# Patient Record
Sex: Female | Born: 1975 | Race: Black or African American | Hispanic: No | Smoking: Never smoker
Health system: Southern US, Community
[De-identification: ages and names within clinical notes are randomized; demographics above are authoritative.]

## PROBLEM LIST (undated history)

## (undated) VITALS — BP 122/70 | HR 102 | Temp 98.5°F | Resp 18 | Ht 68.0 in | Wt 315.0 lb

## (undated) DIAGNOSIS — Z91013 Allergy to seafood: Secondary | ICD-10-CM

## (undated) DIAGNOSIS — R609 Edema, unspecified: Secondary | ICD-10-CM

## (undated) DIAGNOSIS — R81 Glycosuria: Secondary | ICD-10-CM

## (undated) DIAGNOSIS — Z62819 Personal history of unspecified abuse in childhood: Secondary | ICD-10-CM

## (undated) DIAGNOSIS — B379 Candidiasis, unspecified: Secondary | ICD-10-CM

## (undated) DIAGNOSIS — B009 Herpesviral infection, unspecified: Secondary | ICD-10-CM

## (undated) DIAGNOSIS — F32A Depression, unspecified: Secondary | ICD-10-CM

## (undated) DIAGNOSIS — G4730 Sleep apnea, unspecified: Secondary | ICD-10-CM

## (undated) DIAGNOSIS — O2600 Excessive weight gain in pregnancy, unspecified trimester: Secondary | ICD-10-CM

## (undated) DIAGNOSIS — Z8619 Personal history of other infectious and parasitic diseases: Secondary | ICD-10-CM

## (undated) DIAGNOSIS — A599 Trichomoniasis, unspecified: Secondary | ICD-10-CM

## (undated) DIAGNOSIS — R300 Dysuria: Secondary | ICD-10-CM

## (undated) DIAGNOSIS — B977 Papillomavirus as the cause of diseases classified elsewhere: Secondary | ICD-10-CM

## (undated) DIAGNOSIS — F329 Major depressive disorder, single episode, unspecified: Secondary | ICD-10-CM

## (undated) DIAGNOSIS — Z8659 Personal history of other mental and behavioral disorders: Secondary | ICD-10-CM

## (undated) DIAGNOSIS — E559 Vitamin D deficiency, unspecified: Secondary | ICD-10-CM

## (undated) DIAGNOSIS — N879 Dysplasia of cervix uteri, unspecified: Secondary | ICD-10-CM

## (undated) DIAGNOSIS — Z3041 Encounter for surveillance of contraceptive pills: Secondary | ICD-10-CM

## (undated) DIAGNOSIS — Z309 Encounter for contraceptive management, unspecified: Secondary | ICD-10-CM

## (undated) HISTORY — PX: WISDOM TOOTH EXTRACTION: SHX21

## (undated) HISTORY — DX: Personal history of other infectious and parasitic diseases: Z86.19

## (undated) HISTORY — DX: Depression, unspecified: F32.A

## (undated) HISTORY — DX: Excessive weight gain in pregnancy, unspecified trimester: O26.00

## (undated) HISTORY — DX: Personal history of other mental and behavioral disorders: Z86.59

## (undated) HISTORY — DX: Edema, unspecified: R60.9

## (undated) HISTORY — DX: Herpesviral infection, unspecified: B00.9

## (undated) HISTORY — DX: Personal history of unspecified abuse in childhood: Z62.819

## (undated) HISTORY — DX: Vitamin D deficiency, unspecified: E55.9

## (undated) HISTORY — DX: Trichomoniasis, unspecified: A59.9

## (undated) HISTORY — DX: Papillomavirus as the cause of diseases classified elsewhere: B97.7

## (undated) HISTORY — DX: Allergy to seafood: Z91.013

## (undated) HISTORY — DX: Glycosuria: R81

## (undated) HISTORY — DX: Candidiasis, unspecified: B37.9

## (undated) HISTORY — DX: Dysuria: R30.0

## (undated) HISTORY — DX: Major depressive disorder, single episode, unspecified: F32.9

---

## 2006-05-20 HISTORY — PX: GASTRIC BYPASS: SHX52

## 2007-02-19 HISTORY — PX: OTHER SURGICAL HISTORY: SHX169

## 2010-11-08 ENCOUNTER — Other Ambulatory Visit: Payer: Self-pay | Admitting: Unknown Physician Specialty

## 2010-11-08 ENCOUNTER — Ambulatory Visit
Admission: RE | Admit: 2010-11-08 | Discharge: 2010-11-08 | Disposition: A | Discharge status: Home / Self Care | Payer: BC Managed Care – PPO | Source: Ambulatory Visit | Attending: Unknown Physician Specialty | Admitting: Unknown Physician Specialty

## 2010-11-08 DIAGNOSIS — T1490XA Injury, unspecified, initial encounter: Secondary | ICD-10-CM

## 2010-12-27 DIAGNOSIS — R300 Dysuria: Secondary | ICD-10-CM

## 2010-12-27 HISTORY — DX: Dysuria: R30.0

## 2010-12-27 LAB — OB RESULTS CONSOLE GC/CHLAMYDIA
Chlamydia: NEGATIVE
Gonorrhea: NEGATIVE

## 2011-01-23 DIAGNOSIS — B009 Herpesviral infection, unspecified: Secondary | ICD-10-CM | POA: Insufficient documentation

## 2011-01-23 HISTORY — DX: Herpesviral infection, unspecified: B00.9

## 2011-01-31 DIAGNOSIS — E559 Vitamin D deficiency, unspecified: Secondary | ICD-10-CM

## 2011-01-31 HISTORY — DX: Vitamin D deficiency, unspecified: E55.9

## 2011-02-19 NOTE — L&D Delivery Note (Signed)
Delivery Note At 2:18 PM a viable female, "Bianca Strickland", was delivered via Vaginal, Spontaneous Delivery (Presentation: Right Occiput Anterior).  APGAR: 9, 9; weight .   Placenta status: Intact, Spontaneous.  Cord: 3 vessels with the following complications: None.  Cord pH: NA  Anesthesia: Epidural  Episiotomy: None Lacerations: Periurethral;Vaginal;1st degree--repaired for hemostasis Suture Repair: 3.0 vicryl Est. Blood Loss (mL): 400 cc  Mom to postpartum.  Baby to skin to skin.Marland Kitchen  [name] 08/25/2011, 3:00 PM

## 2011-02-25 LAB — OB RESULTS CONSOLE HEPATITIS B SURFACE ANTIGEN: Hepatitis B Surface Ag: NEGATIVE

## 2011-02-25 LAB — OB RESULTS CONSOLE HIV ANTIBODY (ROUTINE TESTING): HIV: NONREACTIVE

## 2011-02-25 LAB — OB RESULTS CONSOLE RUBELLA ANTIBODY, IGM: Rubella: IMMUNE

## 2011-04-23 ENCOUNTER — Other Ambulatory Visit: Payer: BC Managed Care – PPO

## 2011-04-23 ENCOUNTER — Encounter (INDEPENDENT_AMBULATORY_CARE_PROVIDER_SITE_OTHER): Payer: BC Managed Care – PPO | Admitting: Obstetrics and Gynecology

## 2011-04-23 DIAGNOSIS — Z3689 Encounter for other specified antenatal screening: Secondary | ICD-10-CM

## 2011-04-23 DIAGNOSIS — O09519 Supervision of elderly primigravida, unspecified trimester: Secondary | ICD-10-CM

## 2011-05-21 ENCOUNTER — Encounter (INDEPENDENT_AMBULATORY_CARE_PROVIDER_SITE_OTHER): Payer: BC Managed Care – PPO | Admitting: Obstetrics and Gynecology

## 2011-05-21 ENCOUNTER — Other Ambulatory Visit (INDEPENDENT_AMBULATORY_CARE_PROVIDER_SITE_OTHER): Payer: BC Managed Care – PPO

## 2011-05-21 DIAGNOSIS — Z3400 Encounter for supervision of normal first pregnancy, unspecified trimester: Secondary | ICD-10-CM

## 2011-05-21 DIAGNOSIS — O358XX0 Maternal care for other (suspected) fetal abnormality and damage, not applicable or unspecified: Secondary | ICD-10-CM

## 2011-06-17 ENCOUNTER — Encounter: Payer: Self-pay | Admitting: Obstetrics and Gynecology

## 2011-06-17 ENCOUNTER — Other Ambulatory Visit: Payer: Self-pay | Admitting: Obstetrics and Gynecology

## 2011-06-17 DIAGNOSIS — IMO0002 Reserved for concepts with insufficient information to code with codable children: Secondary | ICD-10-CM | POA: Insufficient documentation

## 2011-06-17 DIAGNOSIS — O358XX0 Maternal care for other (suspected) fetal abnormality and damage, not applicable or unspecified: Secondary | ICD-10-CM

## 2011-06-21 ENCOUNTER — Ambulatory Visit (INDEPENDENT_AMBULATORY_CARE_PROVIDER_SITE_OTHER): Payer: BC Managed Care – PPO

## 2011-06-21 ENCOUNTER — Encounter: Payer: Self-pay | Admitting: Obstetrics and Gynecology

## 2011-06-21 ENCOUNTER — Ambulatory Visit (INDEPENDENT_AMBULATORY_CARE_PROVIDER_SITE_OTHER): Payer: BC Managed Care – PPO | Admitting: Obstetrics and Gynecology

## 2011-06-21 ENCOUNTER — Other Ambulatory Visit: Payer: BC Managed Care – PPO

## 2011-06-21 VITALS — BP 102/62 | Wt 274.0 lb

## 2011-06-21 DIAGNOSIS — O358XX0 Maternal care for other (suspected) fetal abnormality and damage, not applicable or unspecified: Secondary | ICD-10-CM

## 2011-06-21 DIAGNOSIS — B009 Herpesviral infection, unspecified: Secondary | ICD-10-CM

## 2011-06-21 DIAGNOSIS — Z331 Pregnant state, incidental: Secondary | ICD-10-CM

## 2011-06-21 DIAGNOSIS — A609 Anogenital herpesviral infection, unspecified: Secondary | ICD-10-CM | POA: Insufficient documentation

## 2011-06-21 LAB — US OB FOLLOW UP

## 2011-06-21 NOTE — Progress Notes (Signed)
Pt c/o pelvic pain and discomfort. Did not give glucola today, urine glucose was 1000 mg/dL.  Pt declined pelvic exam [place] S=D EFW 2-15 86.1 percentile AFI15.7 siup vtx anterior grade 0 placenta EXT bilateral edema left greater than right.  No calf pain Bilaterally  A/P Glucola, hemoglobin and RPR today Fetal kick counts reviewed All patients questions answered Return in two weeks Continue Prenatal vitamins Blood type bpos Glucosuria glucose 76 so glucola given Ted hose prn

## 2011-06-22 LAB — GLUCOSE TOLERANCE, 1 HOUR: Glucose, 1 Hour GTT: 119 mg/dL (ref 70–140)

## 2011-06-23 LAB — RPR

## 2011-07-03 ENCOUNTER — Telehealth: Payer: Self-pay | Admitting: Obstetrics and Gynecology

## 2011-07-03 NOTE — Telephone Encounter (Signed)
Triage/epic 

## 2011-07-03 NOTE — Telephone Encounter (Signed)
PT CALLED, IS 29 WKS, STATES HAD SOME "CLEAR D/C IN THE TOILET" WHEN SHE WENT TO [place] THIS AM, HER URINE USUALLY HAS A COLOR, BUT WHEN SHE LOOKED IN TOILET IT WAS CLEAR, HAD SOME CRAMPING ON Monday BUT BELIEVES IT WAS GAS B/C FELT BETTER AFTER GOING TO [place].  PT ADVISED TO MONITOR TO SEE IF IT HAPPENS AGAIN, CHECK UNDERWEAR OFTEN TO SEE IF IT GETS DAMP OR PUT ON CLEAN DRY PAD TO SEE IF IT GETS WET, CALL WITH ANY CONCERNS, PT DOES HAVE +FM, PT HAS APPT ON Friday, PT OFFERED SOONER APPT, PT SAYS WILL CONT TO MONITOR.

## 2011-07-05 ENCOUNTER — Encounter: Payer: Self-pay | Admitting: Obstetrics and Gynecology

## 2011-07-05 ENCOUNTER — Ambulatory Visit (INDEPENDENT_AMBULATORY_CARE_PROVIDER_SITE_OTHER): Payer: BC Managed Care – PPO | Admitting: Obstetrics and Gynecology

## 2011-07-05 VITALS — BP 112/58 | Ht 68.0 in | Wt 272.0 lb

## 2011-07-05 DIAGNOSIS — Z331 Pregnant state, incidental: Secondary | ICD-10-CM

## 2011-07-05 NOTE — Progress Notes (Signed)
Ext 3 plus edema bilaterally.  No calf tenderness  glucola 119 hgb 10.6 RPR NR FKC reviewed Ted stockings Elevate feet  Increase water intake Pt stated she may have had leaking last week for part of a day, none since.  Pt declines pelvic exam today

## 2011-07-05 NOTE — Patient Instructions (Signed)
Fetal Movement Counts Patient Name: __________________________________________________ Patient Due Date: ____________________ Kick counts is highly recommended in high risk pregnancies, but it is a good idea for every pregnant woman to do. Start counting fetal movements at 28 weeks of the pregnancy. Fetal movements increase after eating a full meal or eating or drinking something sweet (the blood sugar is higher). It is also important to drink plenty of fluids (well hydrated) before doing the count. Lie on your left side because it helps with the circulation or you can sit in a comfortable chair with your arms over your belly (abdomen) with no distractions around you. DOING THE COUNT  Try to do the count the same time of day each time you do it.   Mark the day and time, then see how long it takes for you to feel 10 movements (kicks, flutters, swishes, rolls). You should have at least 10 movements within 2 hours. You will most likely feel 10 movements in much less than 2 hours. If you do not, wait an hour and count again. After a couple of days you will see a pattern.   What you are looking for is a change in the pattern or not enough counts in 2 hours. Is it taking longer in time to reach 10 movements?  SEEK MEDICAL CARE IF:  You feel less than 10 counts in 2 hours. Tried twice.   No movement in one hour.   The pattern is changing or taking longer each day to reach 10 counts in 2 hours.   You feel the baby is not moving as it usually does.  Date: ____________ Movements: ____________ Start time: ____________ Finish time: ____________  Date: ____________ Movements: ____________ Start time: ____________ Finish time: ____________ Date: ____________ Movements: ____________ Start time: ____________ Finish time: ____________ Date: ____________ Movements: ____________ Start time: ____________ Finish time: ____________ Date: ____________ Movements: ____________ Start time: ____________ Finish time:  ____________ Date: ____________ Movements: ____________ Start time: ____________ Finish time: ____________ Date: ____________ Movements: ____________ Start time: ____________ Finish time: ____________ Date: ____________ Movements: ____________ Start time: ____________ Finish time: ____________  Date: ____________ Movements: ____________ Start time: ____________ Finish time: ____________ Date: ____________ Movements: ____________ Start time: ____________ Finish time: ____________ Date: ____________ Movements: ____________ Start time: ____________ Finish time: ____________ Date: ____________ Movements: ____________ Start time: ____________ Finish time: ____________ Date: ____________ Movements: ____________ Start time: ____________ Finish time: ____________ Date: ____________ Movements: ____________ Start time: ____________ Finish time: ____________ Date: ____________ Movements: ____________ Start time: ____________ Finish time: ____________  Date: ____________ Movements: ____________ Start time: ____________ Finish time: ____________ Date: ____________ Movements: ____________ Start time: ____________ Finish time: ____________ Date: ____________ Movements: ____________ Start time: ____________ Finish time: ____________ Date: ____________ Movements: ____________ Start time: ____________ Finish time: ____________ Date: ____________ Movements: ____________ Start time: ____________ Finish time: ____________ Date: ____________ Movements: ____________ Start time: ____________ Finish time: ____________ Date: ____________ Movements: ____________ Start time: ____________ Finish time: ____________  Date: ____________ Movements: ____________ Start time: ____________ Finish time: ____________ Date: ____________ Movements: ____________ Start time: ____________ Finish time: ____________ Date: ____________ Movements: ____________ Start time: ____________ Finish time: ____________ Date: ____________ Movements:  ____________ Start time: ____________ Finish time: ____________ Date: ____________ Movements: ____________ Start time: ____________ Finish time: ____________ Date: ____________ Movements: ____________ Start time: ____________ Finish time: ____________ Date: ____________ Movements: ____________ Start time: ____________ Finish time: ____________  Date: ____________ Movements: ____________ Start time: ____________ Finish time: ____________ Date: ____________ Movements: ____________ Start time: ____________ Finish time: ____________ Date: ____________ Movements: ____________ Start time:   ____________ Finish time: ____________ Date: ____________ Movements: ____________ Start time: ____________ Finish time: ____________ Date: ____________ Movements: ____________ Start time: ____________ Finish time: ____________ Date: ____________ Movements: ____________ Start time: ____________ Finish time: ____________ Date: ____________ Movements: ____________ Start time: ____________ Finish time: ____________  Date: ____________ Movements: ____________ Start time: ____________ Finish time: ____________ Date: ____________ Movements: ____________ Start time: ____________ Finish time: ____________ Date: ____________ Movements: ____________ Start time: ____________ Finish time: ____________ Date: ____________ Movements: ____________ Start time: ____________ Finish time: ____________ Date: ____________ Movements: ____________ Start time: ____________ Finish time: ____________ Date: ____________ Movements: ____________ Start time: ____________ Finish time: ____________ Date: ____________ Movements: ____________ Start time: ____________ Finish time: ____________  Date: ____________ Movements: ____________ Start time: ____________ Finish time: ____________ Date: ____________ Movements: ____________ Start time: ____________ Finish time: ____________ Date: ____________ Movements: ____________ Start time: ____________ Finish  time: ____________ Date: ____________ Movements: ____________ Start time: ____________ Finish time: ____________ Date: ____________ Movements: ____________ Start time: ____________ Finish time: ____________ Date: ____________ Movements: ____________ Start time: ____________ Finish time: ____________ Date: ____________ Movements: ____________ Start time: ____________ Finish time: ____________  Date: ____________ Movements: ____________ Start time: ____________ Finish time: ____________ Date: ____________ Movements: ____________ Start time: ____________ Finish time: ____________ Date: ____________ Movements: ____________ Start time: ____________ Finish time: ____________ Date: ____________ Movements: ____________ Start time: ____________ Finish time: ____________ Date: ____________ Movements: ____________ Start time: ____________ Finish time: ____________ Date: ____________ Movements: ____________ Start time: ____________ Finish time: ____________ Document Released: 03/06/2006 Document Revised: 01/24/2011 Document Reviewed: 09/06/2008 Exit[employer] Patient Information 2012 [employer]. 

## 2011-07-19 ENCOUNTER — Ambulatory Visit (INDEPENDENT_AMBULATORY_CARE_PROVIDER_SITE_OTHER): Payer: BC Managed Care – PPO

## 2011-07-19 VITALS — BP 98/60 | Temp 98.4°F | Wt 275.0 lb

## 2011-07-19 DIAGNOSIS — O99013 Anemia complicating pregnancy, third trimester: Secondary | ICD-10-CM

## 2011-07-19 DIAGNOSIS — R81 Glycosuria: Secondary | ICD-10-CM

## 2011-07-19 DIAGNOSIS — Z91013 Allergy to seafood: Secondary | ICD-10-CM

## 2011-07-19 DIAGNOSIS — Z8659 Personal history of other mental and behavioral disorders: Secondary | ICD-10-CM

## 2011-07-19 DIAGNOSIS — E669 Obesity, unspecified: Secondary | ICD-10-CM

## 2011-07-19 DIAGNOSIS — O99840 Bariatric surgery status complicating pregnancy, unspecified trimester: Secondary | ICD-10-CM

## 2011-07-19 DIAGNOSIS — Z62819 Personal history of unspecified abuse in childhood: Secondary | ICD-10-CM

## 2011-07-19 DIAGNOSIS — N926 Irregular menstruation, unspecified: Secondary | ICD-10-CM | POA: Insufficient documentation

## 2011-07-19 DIAGNOSIS — B9689 Other specified bacterial agents as the cause of diseases classified elsewhere: Secondary | ICD-10-CM

## 2011-07-19 DIAGNOSIS — IMO0002 Reserved for concepts with insufficient information to code with codable children: Secondary | ICD-10-CM

## 2011-07-19 DIAGNOSIS — O2600 Excessive weight gain in pregnancy, unspecified trimester: Secondary | ICD-10-CM | POA: Insufficient documentation

## 2011-07-19 DIAGNOSIS — Z2089 Contact with and (suspected) exposure to other communicable diseases: Secondary | ICD-10-CM

## 2011-07-19 DIAGNOSIS — Z202 Contact with and (suspected) exposure to infections with a predominantly sexual mode of transmission: Secondary | ICD-10-CM

## 2011-07-19 DIAGNOSIS — N760 Acute vaginitis: Secondary | ICD-10-CM

## 2011-07-19 DIAGNOSIS — R609 Edema, unspecified: Secondary | ICD-10-CM

## 2011-07-19 DIAGNOSIS — O09529 Supervision of elderly multigravida, unspecified trimester: Secondary | ICD-10-CM

## 2011-07-19 DIAGNOSIS — Z331 Pregnant state, incidental: Secondary | ICD-10-CM

## 2011-07-19 HISTORY — DX: Edema, unspecified: R60.9

## 2011-07-19 HISTORY — DX: Personal history of other mental and behavioral disorders: Z86.59

## 2011-07-19 HISTORY — DX: Glycosuria: R81

## 2011-07-19 HISTORY — DX: Allergy to seafood: Z91.013

## 2011-07-19 HISTORY — DX: Excessive weight gain in pregnancy, unspecified trimester: O26.00

## 2011-07-19 LAB — POCT WET PREP (WET MOUNT)

## 2011-07-19 LAB — GLUCOSE, POCT (MANUAL RESULT ENTRY): POC Glucose: 86 mg/dL (ref 70–99)

## 2011-07-19 MED ORDER — METRONIDAZOLE 500 MG PO TABS: 500.0000 mg | ORAL_TABLET | Freq: Two times a day (BID) | ORAL | Status: AC

## 2011-07-19 NOTE — Patient Instructions (Signed)
Bacterial Vaginosis Bacterial vaginosis (BV) is a vaginal infection where the normal balance of bacteria in the vagina is disrupted. The normal balance is then replaced by an overgrowth of certain bacteria. There are several different kinds of bacteria that can cause BV. BV is the most common vaginal infection in women of childbearing age. CAUSES   The cause of BV is not fully understood. BV develops when there is an increase or imbalance of harmful bacteria.   Some activities or behaviors can upset the normal balance of bacteria in the vagina and put women at increased risk including:   Having a new sex partner or multiple sex partners.   Douching.   Using an intrauterine device (IUD) for contraception.   It is not clear what role sexual activity plays in the development of BV. However, women that have never had sexual intercourse are rarely infected with BV.  Women do not get BV from toilet seats, bedding, swimming pools or from touching objects around them.  SYMPTOMS   Grey vaginal discharge.   A fish-like odor with discharge, especially after sexual intercourse.   Itching or burning of the vagina and vulva.   Burning or pain with urination.   Some women have no signs or symptoms at all.  DIAGNOSIS  Your caregiver must examine the vagina for signs of BV. Your caregiver will perform lab tests and look at the sample of vaginal fluid through a microscope. They will look for bacteria and abnormal cells (clue cells), a pH test higher than 4.5, and a positive amine test all associated with BV.  RISKS AND COMPLICATIONS   Pelvic inflammatory disease (PID).   Infections following gynecology surgery.   Developing HIV.   Developing herpes virus.  TREATMENT  Sometimes BV will clear up without treatment. However, all women with symptoms of BV should be treated to avoid complications, especially if gynecology surgery is planned. Female partners generally do not need to be treated. However,  BV may spread between female sex partners so treatment is helpful in preventing a recurrence of BV.   BV may be treated with antibiotics. The antibiotics come in either pill or vaginal cream forms. Either can be used with nonpregnant or pregnant women, but the recommended dosages differ. These antibiotics are not harmful to the baby.   BV can recur after treatment. If this happens, a second round of antibiotics will often be prescribed.   Treatment is important for pregnant women. If not treated, BV can cause a premature delivery, especially for a pregnant woman who had a premature birth in the past. All pregnant women who have symptoms of BV should be checked and treated.   For chronic reoccurrence of BV, treatment with a type of prescribed gel vaginally twice a week is helpful.  HOME CARE INSTRUCTIONS   Finish all medication as directed by your caregiver.   Do not have sex until treatment is completed.   Tell your sexual partner that you have a vaginal infection. They should see their caregiver and be treated if they have problems, such as a mild rash or itching.   Practice safe sex. Use condoms. Only have 1 sex partner.  PREVENTION  Basic prevention steps can help reduce the risk of upsetting the natural balance of bacteria in the vagina and developing BV:  Do not have sexual intercourse (be abstinent).   Do not douche.   Use all of the medicine prescribed for treatment of BV, even if the signs and symptoms go away.     Tell your sex partner if you have BV. That way, they can be treated, if needed, to prevent reoccurrence.  SEEK MEDICAL CARE IF:   Your symptoms are not improving after 3 days of treatment.   You have increased discharge, pain, or fever.  MAKE SURE YOU:   Understand these instructions.   Will watch your condition.   Will get help right away if you are not doing well or get worse.  FOR MORE INFORMATION  Division of STD Prevention (DSTDP), Centers for Disease  Control and Prevention: www.cdc.gov/std American Social Health Association (ASHA): www.ashastd.org  Document Released: 02/04/2005 Document Revised: 01/24/2011 Document Reviewed: 07/28/2008 Exit[employer] Patient Information 2012 [employer]. 

## 2011-07-19 NOTE — Progress Notes (Signed)
C/o d/c w/ odor.  Wet prep +BV--Rx'd Flagyl & rec'd Probiotic.  No PTL s/s.  Cx:  Long/closed/softening. Reports FOB not involved.  Disc'd picking Pediatrician.  Rec'd CBE.  Persistent glucosuria; cbg=86. GFM.  Traveled (flew) to [facility] for [employer] to see her family, and reports swelling in legs worse then.  Rev'd further comfort measures; pt has pool in apartment complex.  Pt moved to [place] for her job, but family mainly in [place]. Pt's LLE is worse than her Rt and pt reports even when heavier, her Lt always swells worse.   Discuss iron supplement and diet at Next visit, and start Valtrex. Growth u/s NV.

## 2011-07-19 NOTE — Progress Notes (Signed)
C/o clear d/c with odor Pt had pizza, water& diet ginger ale @ 1p

## 2011-07-22 ENCOUNTER — Telehealth: Payer: Self-pay | Admitting: Obstetrics and Gynecology

## 2011-07-22 NOTE — Telephone Encounter (Signed)
TC TO PT TO INFORM PER [facility] NEED TO ADD A GROWTH U/S TO VISIT IN 2 WKS, PT VOICES UNDERSTANDING, PT'S APPT RESCHEDULED FOR SAME DAY, PT ADVISED TO COME IN @ 1400 FOR U/S AND WILL F/U W/ MK @ 1430. PT VOICES UNDERSTANDING.

## 2011-07-30 ENCOUNTER — Ambulatory Visit (INDEPENDENT_AMBULATORY_CARE_PROVIDER_SITE_OTHER): Payer: BC Managed Care – PPO

## 2011-07-30 ENCOUNTER — Ambulatory Visit (INDEPENDENT_AMBULATORY_CARE_PROVIDER_SITE_OTHER): Payer: BC Managed Care – PPO | Admitting: Obstetrics and Gynecology

## 2011-07-30 ENCOUNTER — Encounter: Payer: BC Managed Care – PPO | Admitting: Obstetrics and Gynecology

## 2011-07-30 VITALS — BP 100/58 | Wt 276.0 lb

## 2011-07-30 DIAGNOSIS — IMO0002 Reserved for concepts with insufficient information to code with codable children: Secondary | ICD-10-CM

## 2011-07-30 DIAGNOSIS — O2600 Excessive weight gain in pregnancy, unspecified trimester: Secondary | ICD-10-CM

## 2011-07-30 DIAGNOSIS — O99840 Bariatric surgery status complicating pregnancy, unspecified trimester: Secondary | ICD-10-CM

## 2011-07-30 DIAGNOSIS — Z331 Pregnant state, incidental: Secondary | ICD-10-CM

## 2011-07-30 DIAGNOSIS — R81 Glycosuria: Secondary | ICD-10-CM

## 2011-07-30 MED ORDER — VALACYCLOVIR HCL 500 MG PO TABS: 500.0000 mg | ORAL_TABLET | Freq: Every day | ORAL | Status: DC

## 2011-07-30 NOTE — Progress Notes (Signed)
[place] 86% growth, vtx, AFI WNL

## 2011-07-30 NOTE — Progress Notes (Signed)
Pt without complaints Blood glucose- 94 Pt had diet sprite & a chicken quesdia between 12:30-1p for lunch

## 2011-07-30 NOTE — Progress Notes (Signed)
Patient ID: Bianca Strickland, female   DOB: 1975-08-24, 37 y.o.   MRN: [id number] Discussed glycuria, 1 gtt 119 at 28 weeks and plan fasting bs, in below 90 continue care if elevated plan 90 or above plan referral diabetic nutrition and CBG x 1 week, RX valtrex given discussed with start daily at 34 weeks, collaboration with Dr. Normand Sloop at office. Reviewed carb modified diet, s/s preterm labor, srom, vag bleeding, kick counts to report, enc 8 water daily and frequent voids Bianca Strickland, CNM

## 2011-08-01 ENCOUNTER — Other Ambulatory Visit (INDEPENDENT_AMBULATORY_CARE_PROVIDER_SITE_OTHER): Payer: BC Managed Care – PPO

## 2011-08-01 DIAGNOSIS — O99810 Abnormal glucose complicating pregnancy: Secondary | ICD-10-CM

## 2011-08-06 LAB — US OB FOLLOW UP

## 2011-08-07 ENCOUNTER — Telehealth: Payer: Self-pay | Admitting: Obstetrics and Gynecology

## 2011-08-07 NOTE — Telephone Encounter (Signed)
Pt states she has nausea and thinks it may be caused by taking Valtrex. She has also experienced diarrhea, had a cough since Sunday, and c/o headache and toothache. Pt states she has an appointment with her dentist tomorrow for toothache. Advised pt to drink plenty of clear fluids and gradually add bland soft foods such as bananas, rice, applesauce, and toast. Also advised pt that Tylenol can be taken for headache and told pt to call office for an appointment if symptoms worsen. Pt voiced understanding.

## 2011-08-07 NOTE — Telephone Encounter (Signed)
Triage/cht received 

## 2011-08-12 ENCOUNTER — Encounter: Payer: BC Managed Care – PPO | Admitting: Obstetrics and Gynecology

## 2011-08-15 ENCOUNTER — Ambulatory Visit (INDEPENDENT_AMBULATORY_CARE_PROVIDER_SITE_OTHER): Payer: BC Managed Care – PPO | Admitting: Obstetrics and Gynecology

## 2011-08-15 ENCOUNTER — Encounter: Payer: Self-pay | Admitting: Obstetrics and Gynecology

## 2011-08-15 VITALS — BP 90/72 | Wt 283.0 lb

## 2011-08-15 DIAGNOSIS — Z331 Pregnant state, incidental: Secondary | ICD-10-CM

## 2011-08-15 MED ORDER — CYCLOBENZAPRINE HCL 5 MG PO TABS: 5.0000 mg | ORAL_TABLET | Freq: Three times a day (TID) | ORAL | Status: AC | PRN

## 2011-08-15 NOTE — Progress Notes (Signed)
C/o L side low back pain radiating down to leg

## 2011-08-15 NOTE — Progress Notes (Signed)
[redacted]w[redacted]d Taking amoxicillin for tooth, ? If can have root canal, rv'd ok for local anesthesia, rec f/u dentist S>D, pt ? IOL, rv'd guidelines, plan [place] for growth NV Pt wearing TED hose, states swelling is normal for her, tho has been worse than usual, enc resting feet elevated,  C/o L back sciatic pain? rec water therapy, chiropractor, RX flexeril Taking valtrex - denies any prodrome or sx's rv'd FKC, and labor sx's

## 2011-08-15 NOTE — Patient Instructions (Signed)
Fetal Movement Counts Patient Name: __________________________________________________ Patient Due Date: ____________________ Kick counts is highly recommended in high risk pregnancies, but it is a good idea for every pregnant woman to do. Start counting fetal movements at 28 weeks of the pregnancy. Fetal movements increase after eating a full meal or eating or drinking something sweet (the blood sugar is higher). It is also important to drink plenty of fluids (well hydrated) before doing the count. Lie on your left side because it helps with the circulation or you can sit in a comfortable chair with your arms over your belly (abdomen) with no distractions around you. DOING THE COUNT  Try to do the count the same time of day each time you do it.   Mark the day and time, then see how long it takes for you to feel 10 movements (kicks, flutters, swishes, rolls). You should have at least 10 movements within 2 hours. You will most likely feel 10 movements in much less than 2 hours. If you do not, wait an hour and count again. After a couple of days you will see a pattern.   What you are looking for is a change in the pattern or not enough counts in 2 hours. Is it taking longer in time to reach 10 movements?  SEEK MEDICAL CARE IF:  You feel less than 10 counts in 2 hours. Tried twice.   No movement in one hour.   The pattern is changing or taking longer each day to reach 10 counts in 2 hours.   You feel the baby is not moving as it usually does.  Date: ____________ Movements: ____________ Start time: ____________ Finish time: ____________  Date: ____________ Movements: ____________ Start time: ____________ Finish time: ____________ Date: ____________ Movements: ____________ Start time: ____________ Finish time: ____________ Date: ____________ Movements: ____________ Start time: ____________ Finish time: ____________ Date: ____________ Movements: ____________ Start time: ____________ Finish time:  ____________ Date: ____________ Movements: ____________ Start time: ____________ Finish time: ____________ Date: ____________ Movements: ____________ Start time: ____________ Finish time: ____________ Date: ____________ Movements: ____________ Start time: ____________ Finish time: ____________  Date: ____________ Movements: ____________ Start time: ____________ Finish time: ____________ Date: ____________ Movements: ____________ Start time: ____________ Finish time: ____________ Date: ____________ Movements: ____________ Start time: ____________ Finish time: ____________ Date: ____________ Movements: ____________ Start time: ____________ Finish time: ____________ Date: ____________ Movements: ____________ Start time: ____________ Finish time: ____________ Date: ____________ Movements: ____________ Start time: ____________ Finish time: ____________ Date: ____________ Movements: ____________ Start time: ____________ Finish time: ____________  Date: ____________ Movements: ____________ Start time: ____________ Finish time: ____________ Date: ____________ Movements: ____________ Start time: ____________ Finish time: ____________ Date: ____________ Movements: ____________ Start time: ____________ Finish time: ____________ Date: ____________ Movements: ____________ Start time: ____________ Finish time: ____________ Date: ____________ Movements: ____________ Start time: ____________ Finish time: ____________ Date: ____________ Movements: ____________ Start time: ____________ Finish time: ____________ Date: ____________ Movements: ____________ Start time: ____________ Finish time: ____________  Date: ____________ Movements: ____________ Start time: ____________ Finish time: ____________ Date: ____________ Movements: ____________ Start time: ____________ Finish time: ____________ Date: ____________ Movements: ____________ Start time: ____________ Finish time: ____________ Date: ____________ Movements:  ____________ Start time: ____________ Finish time: ____________ Date: ____________ Movements: ____________ Start time: ____________ Finish time: ____________ Date: ____________ Movements: ____________ Start time: ____________ Finish time: ____________ Date: ____________ Movements: ____________ Start time: ____________ Finish time: ____________  Date: ____________ Movements: ____________ Start time: ____________ Finish time: ____________ Date: ____________ Movements: ____________ Start time: ____________ Finish time: ____________ Date: ____________ Movements: ____________ Start time:   ____________ Finish time: ____________ Date: ____________ Movements: ____________ Start time: ____________ Finish time: ____________ Date: ____________ Movements: ____________ Start time: ____________ Finish time: ____________ Date: ____________ Movements: ____________ Start time: ____________ Finish time: ____________ Date: ____________ Movements: ____________ Start time: ____________ Finish time: ____________  Date: ____________ Movements: ____________ Start time: ____________ Finish time: ____________ Date: ____________ Movements: ____________ Start time: ____________ Finish time: ____________ Date: ____________ Movements: ____________ Start time: ____________ Finish time: ____________ Date: ____________ Movements: ____________ Start time: ____________ Finish time: ____________ Date: ____________ Movements: ____________ Start time: ____________ Finish time: ____________ Date: ____________ Movements: ____________ Start time: ____________ Finish time: ____________ Date: ____________ Movements: ____________ Start time: ____________ Finish time: ____________  Date: ____________ Movements: ____________ Start time: ____________ Finish time: ____________ Date: ____________ Movements: ____________ Start time: ____________ Finish time: ____________ Date: ____________ Movements: ____________ Start time: ____________ Finish  time: ____________ Date: ____________ Movements: ____________ Start time: ____________ Finish time: ____________ Date: ____________ Movements: ____________ Start time: ____________ Finish time: ____________ Date: ____________ Movements: ____________ Start time: ____________ Finish time: ____________ Date: ____________ Movements: ____________ Start time: ____________ Finish time: ____________  Date: ____________ Movements: ____________ Start time: ____________ Finish time: ____________ Date: ____________ Movements: ____________ Start time: ____________ Finish time: ____________ Date: ____________ Movements: ____________ Start time: ____________ Finish time: ____________ Date: ____________ Movements: ____________ Start time: ____________ Finish time: ____________ Date: ____________ Movements: ____________ Start time: ____________ Finish time: ____________ Date: ____________ Movements: ____________ Start time: ____________ Finish time: ____________ Document Released: 03/06/2006 Document Revised: 01/24/2011 Document Reviewed: 09/06/2008 Exit[employer] Patient Information 2012 [employer].Normal Labor and Delivery Your caregiver must first be sure you are in labor. Signs of labor include:  You may pass what is called "the mucus plug" before labor begins. This is a small amount of blood stained mucus.   Regular uterine contractions.   The time between contractions get closer together.   The discomfort and pain gradually gets more intense.   Pains are mostly located in the back.   Pains get worse when walking.   The cervix (the opening of the uterus becomes thinner (begins to efface) and opens up (dilates).  Once you are in labor and admitted into the hospital or care center, your caregiver will do the following:  A complete physical examination.   Check your vital signs (blood pressure, pulse, temperature and the fetal heart rate).   Do a vaginal examination (using a sterile glove and  lubricant) to determine:   The position (presentation) of the baby (head [vertex] or buttock first).   The level (station) of the baby's head in the birth canal.   The effacement and dilatation of the cervix.   You may have your pubic hair shaved and be given an enema depending on your caregiver and the circumstance.   An electronic monitor is usually placed on your abdomen. The monitor follows the length and intensity of the contractions, as well as the baby's heart rate.   Usually, your caregiver will insert an IV in your arm with a bottle of sugar water. This is done as a precaution so that medications can be given to you quickly during labor or delivery.  NORMAL LABOR AND DELIVERY IS DIVIDED UP INTO 3 STAGES: First Stage This is when regular contractions begin and the cervix begins to efface and dilate. This stage can last from 3 to 15 hours. The end of the first stage is when the cervix is 100% effaced and 10 centimeters dilated. Pain medications may be given by   Injection (morphine, demerol,   etc.)   Regional anesthesia (spinal, caudal or epidural, anesthetics given in different locations of the spine). Paracervical pain medication may be given, which is an injection of and anesthetic on each side of the cervix.  A pregnant woman may request to have "Natural Childbirth" which is not to have any medications or anesthesia during her labor and delivery. Second Stage This is when the baby comes down through the birth canal (vagina) and is born. This can take 1 to 4 hours. As the baby's head comes down through the birth canal, you may feel like you are going to have a bowel movement. You will get the urge to bear down and push until the baby is delivered. As the baby's head is being delivered, the caregiver will decide if an episiotomy (a cut in the perineum and vagina area) is needed to prevent tearing of the tissue in this area. The episiotomy is sewn up after the delivery of the baby and  placenta. Sometimes a mask with nitrous oxide is given for the mother to breath during the delivery of the baby to help if there is too much pain. The end of Stage 2 is when the baby is fully delivered. Then when the umbilical cord stops pulsating it is clamped and cut. Third Stage The third stage begins after the baby is completely delivered and ends after the placenta (afterbirth) is delivered. This usually takes 5 to 30 minutes. After the placenta is delivered, a medication is given either by intravenous or injection to help contract the uterus and prevent bleeding. The third stage is not painful and pain medication is usually not necessary. If an episiotomy was done, it is repaired at this time. After the delivery, the mother is watched and monitored closely for 1 to 2 hours to make sure there is no postpartum bleeding (hemorrhage). If there is a lot of bleeding, medication is given to contract the uterus and stop the bleeding. Document Released: 11/14/2007 Document Revised: 01/24/2011 Document Reviewed: 11/14/2007 Exit[employer] Patient Information 2012 [employer].  

## 2011-08-21 ENCOUNTER — Other Ambulatory Visit: Payer: Self-pay | Admitting: Obstetrics and Gynecology

## 2011-08-21 DIAGNOSIS — IMO0002 Reserved for concepts with insufficient information to code with codable children: Secondary | ICD-10-CM

## 2011-08-23 ENCOUNTER — Ambulatory Visit (INDEPENDENT_AMBULATORY_CARE_PROVIDER_SITE_OTHER): Payer: BC Managed Care – PPO

## 2011-08-23 VITALS — BP 100/62 | Wt 282.0 lb

## 2011-08-23 DIAGNOSIS — IMO0002 Reserved for concepts with insufficient information to code with codable children: Secondary | ICD-10-CM

## 2011-08-23 DIAGNOSIS — Z331 Pregnant state, incidental: Secondary | ICD-10-CM

## 2011-08-23 DIAGNOSIS — N898 Other specified noninflammatory disorders of vagina: Secondary | ICD-10-CM

## 2011-08-23 DIAGNOSIS — L293 Anogenital pruritus, unspecified: Secondary | ICD-10-CM

## 2011-08-23 DIAGNOSIS — B373 Candidiasis of vulva and vagina: Secondary | ICD-10-CM

## 2011-08-23 LAB — US OB FOLLOW UP

## 2011-08-23 MED ORDER — TERCONAZOLE 0.4 % VA CREA: 1.0000 | TOPICAL_CREAM | Freq: Every day | VAGINAL | Status: AC

## 2011-08-23 NOTE — Progress Notes (Signed)
S/p Amoxicillin; has a tooth that needs root canal. c/o VV irritation and itching for a few days; Yeast on wet prep; otherwise WNL. Vulva and introitus with erythema and clumpy white d/c noted as well.  Terazol Rx'd.  U/s today for s>d:  AUA=[redacted]w[redacted]d and EFW=7+3 (76.2%).  AFI=13.3 (40%); ant. Plac.   Pt inquiring r/e stopping work; disc'd w/ pt she is permitted to start maternity leave prn, but no disabling condition for me to place her OOW medically.  Pt stating stressful and joint pains.  Enc'd pt to further discuss w/ her employer, and or MD at [place].  LE edema persists and unchanged.

## 2011-08-23 NOTE — Patient Instructions (Signed)
Monilial Vaginitis Vaginitis in a soreness, swelling and redness (inflammation) of the vagina and vulva. Monilial vaginitis is not a sexually transmitted infection. CAUSES  Yeast vaginitis is caused by yeast (candida) that is normally found in your vagina. With a yeast infection, the candida has overgrown in number to a point that upsets the chemical balance. SYMPTOMS   White, thick vaginal discharge.   Swelling, itching, redness and irritation of the vagina and possibly the lips of the vagina (vulva).   Burning or painful urination.   Painful intercourse.  DIAGNOSIS  Things that may contribute to monilial vaginitis are:  Postmenopausal and virginal states.   Pregnancy.   Infections.   Being tired, sick or stressed, especially if you had monilial vaginitis in the past.   Diabetes. Good control will help lower the chance.   Birth control pills.   Tight fitting garments.   Using bubble bath, feminine sprays, douches or deodorant tampons.   Taking certain medications that kill germs (antibiotics).   Sporadic recurrence can occur if you become ill.  TREATMENT  Your caregiver will give you medication.  There are several kinds of anti monilial vaginal creams and suppositories specific for monilial vaginitis. For recurrent yeast infections, use a suppository or cream in the vagina 2 times a week, or as directed.   Anti-monilial or steroid cream for the itching or irritation of the vulva may also be used. Get your caregiver's permission.   Painting the vagina with methylene blue solution may help if the monilial cream does not work.   Eating yogurt may help prevent monilial vaginitis.  HOME CARE INSTRUCTIONS   Finish all medication as prescribed.   Do not have sex until treatment is completed or after your caregiver tells you it is okay.   Take warm sitz baths.   Do not douche.   Do not use tampons, especially scented ones.   Wear cotton underwear.   Avoid tight  pants and panty hose.   Tell your sexual partner that you have a yeast infection. They should go to their caregiver if they have symptoms such as mild rash or itching.   Your sexual partner should be treated as well if your infection is difficult to eliminate.   Practice safer sex. Use condoms.   Some vaginal medications cause latex condoms to fail. Vaginal medications that harm condoms are:   Cleocin cream.   Butoconazole (Femstat).   Terconazole (Terazol) vaginal suppository.   Miconazole (Monistat) (may be purchased over the counter).  SEEK MEDICAL CARE IF:   You have a temperature by mouth above 102 F (38.9 C).   The infection is getting worse after 2 days of treatment.   The infection is not getting better after 3 days of treatment.   You develop blisters in or around your vagina.   You develop vaginal bleeding, and it is not your menstrual period.   You have pain when you urinate.   You develop intestinal problems.   You have pain with sexual intercourse.  Document Released: 11/14/2004 Document Revised: 01/24/2011 Document Reviewed: 07/29/2008 Exit[employer] Patient Information 2012 [employer]. 

## 2011-08-23 NOTE — Progress Notes (Signed)
C/o irritated and outer itching vulva x couple days  Pt had chicken and broccoli casserole & water about 2 hrs ago

## 2011-08-24 ENCOUNTER — Encounter (HOSPITAL_COMMUNITY): Payer: Self-pay | Admitting: Pediatric Intensive Care

## 2011-08-24 ENCOUNTER — Inpatient Hospital Stay (HOSPITAL_COMMUNITY)
Admission: AD | Admit: 2011-08-24 | Discharge: 2011-08-27 | DRG: 372 | Disposition: A | Discharge status: Home / Self Care | Payer: BC Managed Care – PPO | Source: Ambulatory Visit | Attending: Obstetrics and Gynecology | Admitting: Obstetrics and Gynecology

## 2011-08-24 DIAGNOSIS — O09529 Supervision of elderly multigravida, unspecified trimester: Secondary | ICD-10-CM | POA: Diagnosis present

## 2011-08-24 DIAGNOSIS — Z331 Pregnant state, incidental: Secondary | ICD-10-CM

## 2011-08-24 DIAGNOSIS — Z8659 Personal history of other mental and behavioral disorders: Secondary | ICD-10-CM

## 2011-08-24 DIAGNOSIS — E669 Obesity, unspecified: Secondary | ICD-10-CM | POA: Diagnosis present

## 2011-08-24 DIAGNOSIS — A609 Anogenital herpesviral infection, unspecified: Secondary | ICD-10-CM | POA: Diagnosis present

## 2011-08-24 DIAGNOSIS — O9902 Anemia complicating childbirth: Secondary | ICD-10-CM | POA: Diagnosis present

## 2011-08-24 DIAGNOSIS — O4290 Premature rupture of membranes, unspecified as to length of time between rupture and onset of labor, unspecified weeks of gestation: Secondary | ICD-10-CM | POA: Diagnosis present

## 2011-08-24 DIAGNOSIS — IMO0002 Reserved for concepts with insufficient information to code with codable children: Secondary | ICD-10-CM | POA: Diagnosis present

## 2011-08-24 DIAGNOSIS — O2600 Excessive weight gain in pregnancy, unspecified trimester: Secondary | ICD-10-CM

## 2011-08-24 DIAGNOSIS — D649 Anemia, unspecified: Secondary | ICD-10-CM | POA: Diagnosis present

## 2011-08-24 DIAGNOSIS — O99844 Bariatric surgery status complicating childbirth: Secondary | ICD-10-CM | POA: Diagnosis present

## 2011-08-24 LAB — CBC
Platelets: 301 10*3/uL (ref 150–400)
RBC: 4.34 MIL/uL (ref 3.87–5.11)
RDW: 14.5 % (ref 11.5–15.5)
WBC: 8.9 10*3/uL (ref 4.0–10.5)

## 2011-08-24 LAB — TYPE AND SCREEN
ABO/RH(D): B POS
Antibody Screen: NEGATIVE

## 2011-08-24 MED ORDER — OXYTOCIN BOLUS FROM INFUSION
250.0000 mL | Freq: Once | INTRAVENOUS | Status: AC
  Administered 2011-08-25: 250 mL via INTRAVENOUS
  Filled 2011-08-24: qty 500

## 2011-08-24 MED ORDER — IBUPROFEN 600 MG PO TABS
600.0000 mg | ORAL_TABLET | Freq: Four times a day (QID) | ORAL | Status: DC | PRN
  Administered 2011-08-25: 600 mg via ORAL
  Filled 2011-08-24: qty 1

## 2011-08-24 MED ORDER — OXYTOCIN 40 UNITS IN LACTATED RINGERS INFUSION - SIMPLE MED: 62.5000 mL/h | Freq: Once | INTRAVENOUS | Status: DC

## 2011-08-24 MED ORDER — LIDOCAINE HCL (PF) 1 % IJ SOLN
30.0000 mL | INTRAMUSCULAR | Status: DC | PRN
  Administered 2011-08-25: 30 mL via SUBCUTANEOUS
  Filled 2011-08-24: qty 30

## 2011-08-24 MED ORDER — LACTATED RINGERS IV SOLN
INTRAVENOUS | Status: DC
  Administered 2011-08-24: 21:00:00 via INTRAVENOUS

## 2011-08-24 MED ORDER — PENICILLIN G POTASSIUM 5000000 UNITS IJ SOLR
2.5000 10*6.[IU] | INTRAVENOUS | Status: DC
  Administered 2011-08-25 (×4): 2.5 10*6.[IU] via INTRAVENOUS
  Filled 2011-08-24 (×8): qty 2.5

## 2011-08-24 MED ORDER — PENICILLIN G POTASSIUM 5000000 UNITS IJ SOLR
5.0000 10*6.[IU] | Freq: Once | INTRAMUSCULAR | Status: AC
  Administered 2011-08-24: 5 10*6.[IU] via INTRAVENOUS
  Filled 2011-08-24: qty 5

## 2011-08-24 MED ORDER — ONDANSETRON HCL 4 MG/2ML IJ SOLN: 4.0000 mg | Freq: Four times a day (QID) | INTRAMUSCULAR | Status: DC | PRN

## 2011-08-24 MED ORDER — OXYCODONE-ACETAMINOPHEN 5-325 MG PO TABS: 1.0000 | ORAL_TABLET | ORAL | Status: DC | PRN

## 2011-08-24 MED ORDER — CITRIC ACID-SODIUM CITRATE 334-500 MG/5ML PO SOLN: 30.0000 mL | ORAL | Status: DC | PRN

## 2011-08-24 MED ORDER — ACETAMINOPHEN 325 MG PO TABS: 650.0000 mg | ORAL_TABLET | ORAL | Status: DC | PRN

## 2011-08-24 MED ORDER — LACTATED RINGERS IV SOLN: 500.0000 mL | INTRAVENOUS | Status: DC | PRN

## 2011-08-24 MED ORDER — OXYTOCIN 10 UNIT/ML IJ SOLN: 10.0000 [IU] | Freq: Once | INTRAMUSCULAR | Status: DC

## 2011-08-24 MED ORDER — FENTANYL CITRATE 0.05 MG/ML IJ SOLN
100.0000 ug | INTRAMUSCULAR | Status: DC | PRN
  Administered 2011-08-25 (×2): 100 ug via INTRAVENOUS
  Filled 2011-08-24 (×2): qty 2

## 2011-08-24 NOTE — MAU Note (Signed)
G1 [redacted]w[redacted]d gush of clear fluid about one hour ago x 2 patient pants are saturated, mild cramping on the way to MAU
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
## 2011-08-25 ENCOUNTER — Inpatient Hospital Stay (HOSPITAL_COMMUNITY): Payer: BC Managed Care – PPO | Admitting: Anesthesiology

## 2011-08-25 ENCOUNTER — Encounter (HOSPITAL_COMMUNITY): Payer: Self-pay

## 2011-08-25 ENCOUNTER — Encounter (HOSPITAL_COMMUNITY): Payer: Self-pay | Admitting: Anesthesiology

## 2011-08-25 LAB — STREP B DNA PROBE: GBSP: NEGATIVE

## 2011-08-25 LAB — RPR: RPR Ser Ql: NONREACTIVE

## 2011-08-25 MED ORDER — PRENATAL MULTIVITAMIN CH
1.0000 | ORAL_TABLET | Freq: Every day | ORAL | Status: DC
  Administered 2011-08-25 – 2011-08-27 (×3): 1 via ORAL
  Filled 2011-08-25 (×3): qty 1

## 2011-08-25 MED ORDER — ONDANSETRON HCL 4 MG PO TABS: 4.0000 mg | ORAL_TABLET | ORAL | Status: DC | PRN

## 2011-08-25 MED ORDER — EPHEDRINE 5 MG/ML INJ
10.0000 mg | INTRAVENOUS | Status: DC | PRN
  Filled 2011-08-25: qty 4

## 2011-08-25 MED ORDER — TETANUS-DIPHTH-ACELL PERTUSSIS 5-2.5-18.5 LF-MCG/0.5 IM SUSP
0.5000 mL | Freq: Once | INTRAMUSCULAR | Status: AC
  Administered 2011-08-26: 0.5 mL via INTRAMUSCULAR
  Filled 2011-08-25: qty 0.5

## 2011-08-25 MED ORDER — IBUPROFEN 600 MG PO TABS
600.0000 mg | ORAL_TABLET | Freq: Four times a day (QID) | ORAL | Status: DC
  Administered 2011-08-25 – 2011-08-27 (×6): 600 mg via ORAL
  Filled 2011-08-25 (×7): qty 1

## 2011-08-25 MED ORDER — ONDANSETRON HCL 4 MG/2ML IJ SOLN: 4.0000 mg | INTRAMUSCULAR | Status: DC | PRN

## 2011-08-25 MED ORDER — FENTANYL 2.5 MCG/ML BUPIVACAINE 1/10 % EPIDURAL INFUSION (WH - ANES)
INTRAMUSCULAR | Status: DC | PRN
  Administered 2011-08-25: 13 mL/h via EPIDURAL

## 2011-08-25 MED ORDER — DIPHENHYDRAMINE HCL 25 MG PO CAPS: 25.0000 mg | ORAL_CAPSULE | Freq: Four times a day (QID) | ORAL | Status: DC | PRN

## 2011-08-25 MED ORDER — FENTANYL 2.5 MCG/ML BUPIVACAINE 1/10 % EPIDURAL INFUSION (WH - ANES)
14.0000 mL/h | INTRAMUSCULAR | Status: DC
  Administered 2011-08-25: 14 mL/h via EPIDURAL
  Filled 2011-08-25 (×2): qty 60

## 2011-08-25 MED ORDER — LACTATED RINGERS IV SOLN
500.0000 mL | Freq: Once | INTRAVENOUS | Status: AC
  Administered 2011-08-25: 500 mL via INTRAVENOUS

## 2011-08-25 MED ORDER — DIBUCAINE 1 % RE OINT: 1.0000 "application " | TOPICAL_OINTMENT | RECTAL | Status: DC | PRN

## 2011-08-25 MED ORDER — SIMETHICONE 80 MG PO CHEW: 80.0000 mg | CHEWABLE_TABLET | ORAL | Status: DC | PRN

## 2011-08-25 MED ORDER — LANOLIN HYDROUS EX OINT: TOPICAL_OINTMENT | CUTANEOUS | Status: DC | PRN

## 2011-08-25 MED ORDER — EPHEDRINE 5 MG/ML INJ: 10.0000 mg | INTRAVENOUS | Status: DC | PRN

## 2011-08-25 MED ORDER — SODIUM BICARBONATE 8.4 % IV SOLN
INTRAVENOUS | Status: DC | PRN
  Administered 2011-08-25: 4 mL via EPIDURAL

## 2011-08-25 MED ORDER — WITCH HAZEL-GLYCERIN EX PADS: 1.0000 "application " | MEDICATED_PAD | CUTANEOUS | Status: DC | PRN

## 2011-08-25 MED ORDER — SENNOSIDES-DOCUSATE SODIUM 8.6-50 MG PO TABS
2.0000 | ORAL_TABLET | Freq: Every day | ORAL | Status: DC
  Administered 2011-08-25 – 2011-08-26 (×2): 2 via ORAL

## 2011-08-25 MED ORDER — ZOLPIDEM TARTRATE 5 MG PO TABS: 5.0000 mg | ORAL_TABLET | Freq: Every evening | ORAL | Status: DC | PRN

## 2011-08-25 MED ORDER — PHENYLEPHRINE 40 MCG/ML (10ML) SYRINGE FOR IV PUSH (FOR BLOOD PRESSURE SUPPORT): 80.0000 ug | PREFILLED_SYRINGE | INTRAVENOUS | Status: DC | PRN

## 2011-08-25 MED ORDER — PHENYLEPHRINE 40 MCG/ML (10ML) SYRINGE FOR IV PUSH (FOR BLOOD PRESSURE SUPPORT)
80.0000 ug | PREFILLED_SYRINGE | INTRAVENOUS | Status: DC | PRN
  Filled 2011-08-25: qty 5

## 2011-08-25 MED ORDER — BENZOCAINE-MENTHOL 20-0.5 % EX AERO
1.0000 "application " | INHALATION_SPRAY | CUTANEOUS | Status: DC | PRN
  Administered 2011-08-26: 1 via TOPICAL
  Filled 2011-08-25: qty 56

## 2011-08-25 MED ORDER — OXYTOCIN 40 UNITS IN LACTATED RINGERS INFUSION - SIMPLE MED
1.0000 m[IU]/min | INTRAVENOUS | Status: DC
  Administered 2011-08-25: 1 m[IU]/min via INTRAVENOUS
  Filled 2011-08-25: qty 1000

## 2011-08-25 MED ORDER — DIPHENHYDRAMINE HCL 50 MG/ML IJ SOLN: 12.5000 mg | INTRAMUSCULAR | Status: DC | PRN

## 2011-08-25 MED ORDER — TERBUTALINE SULFATE 1 MG/ML IJ SOLN: 0.2500 mg | Freq: Once | INTRAMUSCULAR | Status: DC | PRN

## 2011-08-25 MED ORDER — OXYCODONE-ACETAMINOPHEN 5-325 MG PO TABS
1.0000 | ORAL_TABLET | ORAL | Status: DC | PRN
  Administered 2011-08-26 – 2011-08-27 (×2): 1 via ORAL
  Filled 2011-08-25 (×2): qty 1

## 2011-08-25 NOTE — Anesthesia Postprocedure Evaluation (Signed)
  Anesthesia Post-op Note  Patient: Bianca Strickland  Procedure(s) Performed: * No procedures listed *  Patient Location: PACU and Mother/Baby  Anesthesia Type: Epidural  Level of Consciousness: awake, alert  and oriented  Airway and Oxygen Therapy: Patient Spontanous Breathing    Post-op Assessment: Patient's Cardiovascular Status Stable and Respiratory Function Stable  Post-op Vital Signs: stable  Complications: No apparent anesthesia complications

## 2011-08-25 NOTE — Anesthesia Preprocedure Evaluation (Signed)
Anesthesia Evaluation  Patient identified by MRN, date of birth, ID band Patient awake    Reviewed: Allergy & Precautions, H&P , Patient's Chart, lab work & pertinent test results  Airway Mallampati: III  TM Distance: >3 FB Neck ROM: full    Dental  (+) Teeth Intact   Pulmonary  breath sounds clear to auscultation        Cardiovascular Rhythm:regular Rate:Normal     Neuro/Psych    GI/Hepatic   Endo/Other  Morbid obesity  Renal/GU      Musculoskeletal   Abdominal   Peds  Hematology   Anesthesia Other Findings       Reproductive/Obstetrics (+) Pregnancy                             Anesthesia Physical Anesthesia Plan  ASA: III  Anesthesia Plan: Epidural   Post-op Pain Management:    Induction:   Airway Management Planned:   Additional Equipment:   Intra-op Plan:   Post-operative Plan:   Informed Consent: I have reviewed the patients History and Physical, chart, labs and discussed the procedure including the risks, benefits and alternatives for the proposed anesthesia with the patient or authorized representative who has indicated his/her understanding and acceptance.   Dental Advisory Given  Plan Discussed with:   Anesthesia Plan Comments: (Labs checked- platelets confirmed with RN in room. Fetal heart tracing, per RN, reported to be stable enough for sitting procedure. Discussed epidural, and patient consents to the procedure:  included risk of possible headache,backache, failed block, allergic reaction, and nerve injury. This patient was asked if she had any questions or concerns before the procedure started.)        Anesthesia Quick Evaluation  

## 2011-08-25 NOTE — Progress Notes (Signed)
Armband of FOB removed per FOB request. FOB states he does not want the armband and will not return to the hospital. Armband shredded. Nursery and [facility] notified of event.  

## 2011-08-25 NOTE — Progress Notes (Signed)
Patient ID: Bianca Strickland, female   DOB: February 25, 1975, 36 y.o.   MRN: [id number] .Subjective: Has been resting between ctx, states they feel a little more uncomfortable, menstrual like cramps, denies need for medications at the moment, still leaking clear fluid, some pink show   Objective: BP 163/68  Pulse 62  Temp 98.4 F (36.9 C) (Axillary)  Resp 20  Ht 5\' 8"  (1.727 m)  Wt 276 lb (125.193 kg)  BMI 41.97 kg/m2   FHT:  FHR: 150 bpm, variability: moderate,  accelerations:  Present,  decelerations:  Absent UC:   regular, every 5-8 minutes SVE:   Dilation: 2 Effacement (%): 100 Station: -2 Exam by:: Jamey Harman CNM  Cervix more anterior, stretches to 2+  Assessment / Plan: Spontaneous labor, progressing normally Early labor GBS unk, tx'd w PCN   Fetal Wellbeing:  Category I Pain Control:  Labor support without medications  Update physician PRN  Howie Rufus M 08/25/2011, 2:39 AM

## 2011-08-25 NOTE — Anesthesia Procedure Notes (Signed)
Epidural Patient location during procedure: OB  Preanesthetic Checklist Completed: patient identified, site marked, surgical consent, pre-op evaluation, timeout performed, IV checked, risks and benefits discussed and monitors and equipment checked  Epidural Patient position: sitting Prep: site prepped and draped and DuraPrep Patient monitoring: continuous pulse ox and blood pressure Approach: midline Injection technique: LOR air  Needle:  Needle type: Tuohy  Needle gauge: 17 G Needle length: 9 cm Needle insertion depth: 8 cm Catheter type: closed end flexible Catheter size: 19 Gauge Test dose: negative  Assessment Events: blood not aspirated, injection not painful, no injection resistance, negative IV test and no paresthesia  Additional Notes Dosing of Epidural:  1st dose, through needle ............................................Marland Kitchen epi 1:200K + Xylocaine 40 mg  2nd dose, through catheter, after waiting 3 minutes...Marland KitchenMarland Kitchenepi 1:200K + Xylocaine 40 mg  3rd dose, through catheter after waiting 3 minutes .............................Marcaine   4mg    ( mg Marcaine are expressed as equivilent  cc's medication removed from the 0.1%Bupiv / fentanyl syringe from L&D pump)  ( 2% Xylo charted as a single dose in Epic Meds for ease of charting; actual dosing was fractionated as above, for saftey's sake)  As each dose occurred, patient was free of IV sx; and patient exhibited no evidence of SA injection.  Patient is more comfortable after epidural dosed. Please see RN's note for documentation of vital signs,and FHR which are stable.  Patient reminded not to try to ambulate with numb legs, and that an RN must be present the 1st time she attempts to get up.

## 2011-08-25 NOTE — Progress Notes (Signed)
Patient ID: Bianca Strickland, female   DOB: 02/01/1976, 36 y.o.   MRN: [id number] .Subjective:  Pt has been resting some, ctx about the same  Objective: BP 141/73  Pulse 97  Temp 99.1 F (37.3 C) (Oral)  Resp 18  Ht 5\' 8"  (1.727 m)  Wt 276 lb (125.193 kg)  BMI 41.97 kg/m2   FHT:  FHR: 140 bpm, variability: moderate,  accelerations:  Present,  decelerations:  Present occ mild variable UC:   irregular, every 2-5 minutes SVE:   Dilation: 1 Effacement (%): 90 Exam by:: Shatera Rennert CNM  VE deferred  Assessment / Plan: PROM No active labor GBS unk, tx'd w PCN  Will CTO at present   Fetal Wellbeing:  Category I Pain Control:  n/a  Update physician PRN  Malissa Hippo 08/25/2011, 1:24 AM

## 2011-08-25 NOTE — Progress Notes (Signed)
  Subjective: Just received 2nd dose of Fentanyl.    Objective: BP 121/65  Pulse 69  Temp 98.9 F (37.2 C) (Oral)  Resp 20  Ht 5\' 8"  (1.727 m)  Wt 276 lb (125.193 kg)  BMI 41.97 kg/m2     Filed Vitals:   08/25/11 0636 08/25/11 0658 08/25/11 0750 08/25/11 0811  BP: 141/69  119/60 121/65  Pulse: 62  66 69  Temp: 98.9 F (37.2 C)     TempSrc: Oral     Resp: 20 18 20    Height:      Weight:         FHT:  Category 1 UC:   Irregular patter and quality, q 2 -5 min, mild/moderate SVE:   Dilation: 4 Effacement (%): 100 Station: -2;-1 Exam by:: Emilee Hero, CNM Cervix very soft and thin.  Labs: Lab Results  Component Value Date   WBC 8.9 08/24/2011   HGB 11.4* 08/24/2011   HCT 35.6* 08/24/2011   MCV 82.0 08/24/2011   PLT 301 08/24/2011    Assessment / Plan: IUP at 37 weeks SROM at 6pm Pending GBS status Early labor  Plan: Reviewed status with patient--I still recommend pitocin augmentation due to irregular quality/pattern of UCs. Per patient request, will place epidural before increasing pitocin. Continue GBS prophylaxis, since GBS status still pending from 08/23/11.   Nigel Bridgeman 08/25/2011, 8:19 AM

## 2011-08-25 NOTE — Progress Notes (Signed)
POC discussed. Questions answered. Will plan to start Pitocin Augmentation.

## 2011-08-25 NOTE — Progress Notes (Signed)
Patient ID: Bianca Strickland, female   DOB: May 30, 1975, 36 y.o.   MRN: [id number] .Subjective:  rcv'd IV fentanyl about 4am, w good relief of pain, tho states it's increasing again.   Objective: BP 141/69  Pulse 62  Temp 98.9 F (37.2 C) (Oral)  Resp 18  Ht 5\' 8"  (1.727 m)  Wt 276 lb (125.193 kg)  BMI 41.97 kg/m2   FHT:  FHR: 140 bpm, variability: moderate,  accelerations:  Present,  decelerations:  Absent UC:   regular, every 4-5  minutes SVE:   Dilation: 2 Effacement (%): 100 Station: -2 Exam by:: Nathanuel Cabreja CNM  VE deferred at present  Assessment / Plan: SROM Likely still early labor  rv'd recommendation for pitocin augmentation pt agrees  GBS unk, tx'd w PCN  Discussed pain mgmnt and pt will request epidural PRN  Fetal Wellbeing:  Category I Pain Control:  Fentanyl  Update physician PRN  Malissa Hippo 08/25/2011, 7:09 AM

## 2011-08-25 NOTE — Progress Notes (Signed)
  Subjective: Very comfortable, with some rectal pressure.  Objective: BP 108/47  Pulse 90  Temp 99 F (37.2 C) (Oral)  Resp 20  Ht 5\' 8"  (1.727 m)  Wt 276 lb (125.193 kg)  BMI 41.97 kg/m2  SpO2 100%      FHT:  Category 1 UC:   regular, every 2-3 minutes SVE:   Dilation: 10 Effacement (%): 100 Station: +1 Exam by:: Emilee Hero, CNM Minimal descent with trial push.  Labs: Lab Results  Component Value Date   WBC 8.9 08/24/2011   HGB 11.4* 08/24/2011   HCT 35.6* 08/24/2011   MCV 82.0 08/24/2011   PLT 301 08/24/2011    Assessment / Plan: Will await increased pressure/urge to push.    Nigel Bridgeman 08/25/2011, 12:56 PM

## 2011-08-26 LAB — CBC
Hemoglobin: 9.9 g/dL — ABNORMAL LOW (ref 12.0–15.0)
MCH: 26.5 pg (ref 26.0–34.0)
MCV: 82.6 fL (ref 78.0–100.0)
RBC: 3.73 MIL/uL — ABNORMAL LOW (ref 3.87–5.11)

## 2011-08-26 NOTE — Progress Notes (Addendum)
Post Partum Day 1:S/P SVB Subjective: Tearful this am--feels "overwhelmed".  Having some back pain, perineal pain.  Using Motrin only.    Up ad lib without syncope or dizziness.  Had episode of sweating last night, then "got cold".  No further episodes since then. FOB left the hospital after the birth, informed patient he "wouldn't be coming back".  Patient advises yesterday was the first time FOB met her mom.    Feeding:  Breast Contraceptive plan:   Undecided  Objective: Blood pressure 101/65, pulse 68, temperature 98.1 F (36.7 C), temperature source Oral, resp. rate 20, height 5\' 8"  (1.727 m), weight 276 lb (125.193 kg), SpO2 99.00%, unknown if currently breastfeeding.  Physical Exam:  General: alert Lochia: appropriate Uterine Fundus: firm Incision: healing well DVT Evaluation: No evidence of DVT seen on physical exam. Negative Homan's sign.   Basename 08/26/11 0510 08/24/11 2110  HGB 9.9* 11.4*  HCT 30.8* 35.6*   Results for orders placed during the hospital encounter of 08/24/11 (from the past 24 hour(s))  CBC     Status: Abnormal   Collection Time   08/26/11  5:10 AM      Component Value Range   WBC 9.6  4.0 - 10.5 K/uL   RBC 3.73 (*) 3.87 - 5.11 MIL/uL   Hemoglobin 9.9 (*) 12.0 - 15.0 g/dL   HCT 82.9 (*) 56.2 - 13.0 %   MCV 82.6  78.0 - 100.0 fL   MCH 26.5  26.0 - 34.0 pg   MCHC 32.1  30.0 - 36.0 g/dL   RDW 86.5  78.4 - 69.6 %   Platelets 244  150 - 400 K/uL     Assessment/Plan: Reviewed issues with patient--support offered, feelings explored, symptoms reviewed. Recommend patient use Percocet as addition to Motrin. SW consult already ordered. Will change to sitz bath at 24 hours. Check orthostatic vs.    LOS: 2 days   Nigel Bridgeman 08/26/2011, 8:47 AM

## 2011-08-26 NOTE — Clinical Social Work Psychosocial (Signed)
    Clinical Social Work Department BRIEF PSYCHOSOCIAL ASSESSMENT 08/26/2011  Patient:  Bianca Strickland, Bianca Strickland     Account Number:  [id number]     Admit date:  08/24/2011  Clinical Social Worker:  Andy Gauss  Date/Time:  08/26/2011 03:04 PM  Referred by:  Physician  Date Referred:  08/26/2011 Referred for  Behavioral Health Issues   Other Referral:   Hx of depression   Interview type:  Patient Other interview type:    PSYCHOSOCIAL DATA Living Status:  ALONE Admitted from facility:   Level of care:   Primary support name:   Primary support relationship to patient:  PARENT Degree of support available:   Involved    CURRENT CONCERNS Current Concerns  Behavioral Health Issues   Other Concerns:    SOCIAL WORK ASSESSMENT / PLAN Sw referral received to assess history of depression.  Pt admits to feeling depressed in the past and sought counseling services with Dr. Ranae Palms.  Pt participated in weekly therapy sessions for 2 months before she stopped attending.  It was recommended that she start an antidepressant however pt did not consent, due to unknown risk to the infant.  She denies depression at this time but does admit to feeling "overwhelmed."  Her mother is visiting from [place] and plans to stay until the end of the month.  Her stepmother will plans to stay with her the following month.  FOB is not involved and pt was not open to discussing circumstances in detail.  She agrees to contact her therapist or doctor if PP depression symptoms arise.  She denies history of SI.  Pt's affect appears to be flat.  She reports having some support but states she will be raising the baby alone.  Sw encouraged pt to follow up with mental health provider upon discharge, to address possible PP depression symptoms.  She has all the necessary supplies for the infant.   Assessment/plan status:  No Further Intervention Required Other assessment/ plan:   Information/referral to community resources:     PATIENT'S/FAMILY'S RESPONSE TO PLAN OF CARE: Pt will follow up with mental health provider  (Dr. Ranae Palms) if needed.

## 2011-08-27 MED ORDER — OXYCODONE-ACETAMINOPHEN 5-325 MG PO TABS: 1.0000 | ORAL_TABLET | ORAL | Status: AC | PRN

## 2011-08-27 MED ORDER — IBUPROFEN 600 MG PO TABS: 600.0000 mg | ORAL_TABLET | Freq: Four times a day (QID) | ORAL | Status: AC | PRN

## 2011-08-27 NOTE — Discharge Summary (Signed)
Obstetric Discharge Summary Reason for Admission: onset of labor, SROM, pending GBS at time of admission Prenatal Procedures: NST and ultrasound Intrapartum Procedures: spontaneous vaginal delivery Postpartum Procedures: none Complications-Operative and Postpartum: Right and left 1st degree periurethral lacerations and 1st degree vaginal laceration. Hemoglobin  Date Value Range Status  08/26/2011 9.9* 12.0 - 15.0 g/dL Final  07/21/1306 65.7   Final     HCT  Date Value Range Status  08/26/2011 30.8* 36.0 - 46.0 % Final  02/25/2011 35   Final   Hospital Course: Admitted 08/24/11 with SROM, early labor. Pending GBS from 08/23/11. Progressed after epidural and pitocin initiated. Delivery was performed by Nigel Bridgeman, CNM, [place], without complication. Patient and baby tolerated the procedure without difficulty, with right and left 1st degree periurethral and 1st degree vaginal laceration noted. Infant to FTN. Patient did have some feelings of being overwhelmed, with some issues with FOB.  SW consult was ordered, and the patient was aware of resources for depression and support available to her pp.  She already has a relationship with a counselor and plans follow-up with that individual pp.  She denies active pp depression at this point. Mother and infant then had an uncomplicated postpartum course, with breast feeding going well. Mom's physical exam was WNL, and she was discharged home in stable condition on day 2. Contraception plan was undecided at this point, other than abstinence.  She received adequate benefit from po pain medications and was sent home on Motrin and Percocet.      Physical Exam:  General: alert Lochia: appropriate Uterine Fundus: firm Incision: healing well DVT Evaluation: No evidence of DVT seen on physical exam. Negative Homan's sign. Calf/Ankle edema is present, but stable from pre-delivery status.  Hx chronic lymphadema, L>R.  Discharge Diagnoses: Term  Pregnancy-delivered  Discharge Information: Date: 08/27/2011 Activity: Per [facility] handout Diet: routine Medications: Ibuprofen, Iron and Percocet Condition: stable Instructions: refer to practice specific booklet Discharge to: home Contraception:  Undecided at present Follow-up Information    Follow up with Nigel Bridgeman, CNM in 6 weeks. (Call to make 6 week appointment, and call for any questions or concerns.)    Contact information:   [address] [place] [address] [phone]          Newborn Data: Live born female  Birth Weight: 6 lb 14.4 oz (3130 g) APGAR: 9, 9  Home with mother.  Nigel Bridgeman 08/27/2011, 8:17 AM

## 2011-08-28 ENCOUNTER — Encounter: Payer: BC Managed Care – PPO | Admitting: Obstetrics and Gynecology

## 2011-09-02 ENCOUNTER — Telehealth: Payer: Self-pay | Admitting: Obstetrics and Gynecology

## 2011-09-02 NOTE — Telephone Encounter (Signed)
VM received.   Pt requests order for breast pump for insurance purposes.   TC to pt. LM to return call.

## 2011-09-02 NOTE — Telephone Encounter (Signed)
TC from pt.   States delivered 08/25/11.   Would like breast pump to use  So she can store milk for when she returns to work. Denies any problems.  Needs written RX for insurance.   To be left at from desk for [facility] electronic breast pump.

## 2011-09-10 ENCOUNTER — Other Ambulatory Visit: Payer: Self-pay | Admitting: Obstetrics and Gynecology

## 2011-09-10 NOTE — Telephone Encounter (Signed)
Triage/epic 

## 2011-09-10 NOTE — Telephone Encounter (Signed)
Pt called, delivered 08/25/11, has been crying a lot, denies any SI/HI, has counselor was told to call office to have anti-depressants Rx'd by [place], was not on any meds during the pregnancy, pt advised needs appt, pt voices understanding.  Appt sched for tomorrow 09/11/11 @ 1545 to discuss.  Pt to call after hours with any concerns.

## 2011-09-11 ENCOUNTER — Encounter: Payer: Self-pay | Admitting: Obstetrics and Gynecology

## 2011-09-11 ENCOUNTER — Ambulatory Visit (INDEPENDENT_AMBULATORY_CARE_PROVIDER_SITE_OTHER): Payer: BC Managed Care – PPO | Admitting: Obstetrics and Gynecology

## 2011-09-11 VITALS — BP 90/60 | Ht 68.0 in | Wt 259.0 lb

## 2011-09-11 DIAGNOSIS — IMO0002 Reserved for concepts with insufficient information to code with codable children: Secondary | ICD-10-CM

## 2011-09-11 DIAGNOSIS — O99345 Other mental disorders complicating the puerperium: Secondary | ICD-10-CM | POA: Insufficient documentation

## 2011-09-11 DIAGNOSIS — F53 Postpartum depression: Secondary | ICD-10-CM | POA: Insufficient documentation

## 2011-09-11 MED ORDER — ESCITALOPRAM OXALATE 10 MG PO TABS: 10.0000 mg | ORAL_TABLET | Freq: Every day | ORAL | Status: DC

## 2011-09-11 NOTE — Progress Notes (Signed)
Denies SI or HI Sees therapist Ulice Bold at [facility] who rec meds during preg Pt was on lexapro about 50yrs ago with benefit Explained lactation safety unknown - Risk Benefit ration discussed - Pt would like to start lexapro again

## 2011-09-20 DIAGNOSIS — A599 Trichomoniasis, unspecified: Secondary | ICD-10-CM | POA: Insufficient documentation

## 2011-09-20 DIAGNOSIS — B977 Papillomavirus as the cause of diseases classified elsewhere: Secondary | ICD-10-CM | POA: Insufficient documentation

## 2011-09-20 DIAGNOSIS — F329 Major depressive disorder, single episode, unspecified: Secondary | ICD-10-CM | POA: Insufficient documentation

## 2011-09-20 DIAGNOSIS — B379 Candidiasis, unspecified: Secondary | ICD-10-CM | POA: Insufficient documentation

## 2011-09-26 ENCOUNTER — Encounter: Payer: Self-pay | Admitting: Obstetrics and Gynecology

## 2011-09-26 ENCOUNTER — Ambulatory Visit (INDEPENDENT_AMBULATORY_CARE_PROVIDER_SITE_OTHER): Payer: BC Managed Care – PPO | Admitting: Obstetrics and Gynecology

## 2011-09-26 ENCOUNTER — Telehealth: Payer: Self-pay

## 2011-09-26 VITALS — BP 104/62 | Ht 68.0 in | Wt 258.0 lb

## 2011-09-26 DIAGNOSIS — D649 Anemia, unspecified: Secondary | ICD-10-CM

## 2011-09-26 DIAGNOSIS — O99345 Other mental disorders complicating the puerperium: Secondary | ICD-10-CM

## 2011-09-26 DIAGNOSIS — F489 Nonpsychotic mental disorder, unspecified: Secondary | ICD-10-CM

## 2011-09-26 DIAGNOSIS — F53 Postpartum depression: Secondary | ICD-10-CM

## 2011-09-26 DIAGNOSIS — Z8659 Personal history of other mental and behavioral disorders: Secondary | ICD-10-CM

## 2011-09-26 DIAGNOSIS — F329 Major depressive disorder, single episode, unspecified: Secondary | ICD-10-CM

## 2011-09-26 MED ORDER — IRON SUCROSE 20 MG/ML IV SOLN: 100.0000 mg | Freq: Every day | INTRAVENOUS | Status: DC

## 2011-09-26 MED ORDER — SERTRALINE HCL 50 MG PO TABS: 50.0000 mg | ORAL_TABLET | Freq: Every day | ORAL | Status: DC

## 2011-09-26 NOTE — Patient Instructions (Signed)
Feosol once daily  Iron Deficiency Anemia There are many types of anemia. Iron deficiency anemia is the most common. Iron deficiency anemia is a decrease in the number of red blood cells caused by too little iron. Without enough iron, your body does not produce enough hemoglobin. Hemoglobin is a substance in red blood cells that carries oxygen to the body's tissues. Iron deficiency anemia may leave you tired and short of breath. CAUSES   Lack of iron in the diet.   This may be seen in infants and children, because there is little iron in milk.   This may be seen in adults who do not eat enough iron-rich foods.   This may be seen in pregnant or breastfeeding women who do not take iron supplements. There is a much higher need for iron intake at these times.   Poor absorption of iron, as seen with intestinal disorders.   Intestinal bleeding.   Heavy periods.  SYMPTOMS  Mild anemia may not be noticeable. Symptoms may include:  Fatigue.   Headache.   Pale skin.   Weakness.   Shortness of breath.   Dizziness.   Cold hands and feet.   Fast or irregular heartbeat.  DIAGNOSIS  Diagnosis requires a thorough evaluation and physical exam by your caregiver.  Blood tests are generally used to confirm iron deficiency anemia.   Additional tests may be done to find the underlying cause of your anemia. These may include:   Testing for blood in the stool (fecal occult blood test).   A procedure to see inside the colon and rectum (colonoscopy).   A procedure to see inside the esophagus and stomach (endoscopy).  TREATMENT   Correcting the cause of the iron deficiency is the first step.   Medicines, such as oral contraceptives, can make heavy menstrual flows lighter.   Antibiotics and other medicines can be used to treat peptic ulcers.   Surgery may be needed to remove a bleeding polyp, tumor, or fibroid.   Often, iron supplements (ferrous sulfate) are taken.   For the best  iron absorption, take these supplements with an empty stomach.   You may need to take the supplements with food if you cannot tolerate them on an empty stomach. Vitamin C improves the absorption of iron. Your caregiver may recommend taking your iron tablets with a glass of orange juice or vitamin C supplement.   Milk and antacids should not be taken at the same time as iron supplements. They may interfere with the absorption of iron.   Iron supplements can cause constipation. A stool softener is often recommended.   Pregnant and breastfeeding women will need to take extra iron, because their normal diet usually will not provide the required amount.   Patients who cannot tolerate iron by mouth can take it through a vein (intravenously) or by an injection into the muscle.  HOME CARE INSTRUCTIONS   Ask your dietitian for help with diet questions.   Take iron and vitamins as directed by your caregiver.   Eat a diet rich in iron. Eat liver, lean beef, whole-grain bread, eggs, dried fruit, and dark green leafy vegetables.  SEEK IMMEDIATE MEDICAL CARE IF:   You have a fainting episode. Do not drive yourself. Call your local emergency services (911 in [place] if no other help is available.   You have chest pain, nausea, or vomiting.   You develop severe or increased shortness of breath with activities.   You develop weakness or increased thirst.  You have a rapid heartbeat.   You develop unexplained sweating or become lightheaded when getting up from a chair or bed.  MAKE SURE YOU:   Understand these instructions.   Will watch your condition.   Will get help right away if you are not doing well or get worse.  Document Released: 02/02/2000 Document Revised: 01/24/2011 Document Reviewed: 06/13/2009 [facility] Patient Information 2012 [place], [place].

## 2011-09-26 NOTE — Progress Notes (Signed)
GYN PROBLEM VISIT  [name] is a 36 y.o. year old female,G2P1011, who presents for a problem visit.   Subjective:  Pt here to f/u on visit from 09/11/2011 for post partum depression. She was seen during her pregnancy for chronic depression, but declined to take meds. EPDS score at that visit was 19. Was given Lexapro. States that she took med for 1 week and didn't notice change in mood.  She also has difficulty with nausea since starting the Lexapro.  She took it many years ago prior to her gastric bypass, but not since then. States that she feels the medication was making baby "groggy". She denies homicidal of suicidal ideation.  She is eating okay, but not resting well.  EPDS score today = 23.  Objective:  BP 104/62  Ht 5\' 8"  (1.727 m)  Wt 258 lb (117.028 kg)  BMI 39.23 kg/m2  Breastfeeding? Yes    Assessment: Postpartum exacerbation of chronic depression GI side effects from Lexapro Chronic anemia Plan: D/C Lexapro Begin Zoloft 50 mg qd Increase therapy sessions to weekly Feosol once daily Written info re anemia and iron rich foods Begin walking 30 mins daily for exercise for a week prior to returning to [place] Will refer to psychiatrist at [facility] for managemnt of meds F/U this office in 1 week   Dierdre Forth,   09/26/2011 11:34 AM

## 2011-09-26 NOTE — Telephone Encounter (Signed)
Per [facility] called [facility] [phone] to advised that pt needed to be scheduled with psychiatrist due to postpartum depression. Advised that pt was put on depression medication and we were having difficulty managing this. Receptionist advised that she already has apt. scheduled with psychiatrist on 10/10/2011 and is on the waiting list for a sooner apt.

## 2011-10-03 ENCOUNTER — Encounter: Payer: Self-pay | Admitting: Obstetrics and Gynecology

## 2011-10-03 ENCOUNTER — Ambulatory Visit (INDEPENDENT_AMBULATORY_CARE_PROVIDER_SITE_OTHER): Payer: BC Managed Care – PPO | Admitting: Obstetrics and Gynecology

## 2011-10-03 VITALS — BP 104/68 | Ht 68.0 in | Wt 255.0 lb

## 2011-10-03 DIAGNOSIS — IMO0002 Reserved for concepts with insufficient information to code with codable children: Secondary | ICD-10-CM

## 2011-10-03 DIAGNOSIS — O99345 Other mental disorders complicating the puerperium: Secondary | ICD-10-CM

## 2011-10-03 DIAGNOSIS — Z8659 Personal history of other mental and behavioral disorders: Secondary | ICD-10-CM

## 2011-10-03 MED ORDER — CLOMIPHENE CITRATE 50 MG PO TABS: ORAL_TABLET | ORAL | Status: DC

## 2011-10-03 NOTE — Progress Notes (Signed)
GYN PROBLEM VISIT Subjective:  [name] is a 36 y.o. year old female,G2P1011, who presents for a problem visit. She has postpartum depression that was initially treated with Lexapro. She experienced side effects from the Lexapro and discontinued that last week starting Zoloft. She states that she still has some GI upset from the Zoloft but feels this may be associated with her history of gastric bypass. It is not unexpected that she does not have any significant change in her mood since starting the Zoloft one week ago. She has an appointment with her psychiatrist on tomorrow.    Objective:  BP 104/68  Ht 5\' 8"  (1.727 m)  Wt 255 lb (115.667 kg)  BMI 38.77 kg/m2  Breastfeeding? Yes    Assessment: Postpartum depression with history of chronic depression  Plan: Continue Zoloft 50 mg daily. Keep appointment with psychiatrist on October 04, 2011 Extended postpartum disability until October 4 Verbal contract made with the patient should she develop suicidal or homicidal ideation. She denies either at this time. Followup 2 weeks for full postpartum examination    Dierdre Forth, MD  10/03/2011 2:51 PM

## 2011-10-10 ENCOUNTER — Ambulatory Visit: Payer: BC Managed Care – PPO | Admitting: Obstetrics and Gynecology

## 2011-10-16 ENCOUNTER — Encounter: Payer: Self-pay | Admitting: Obstetrics and Gynecology

## 2011-10-16 ENCOUNTER — Ambulatory Visit (INDEPENDENT_AMBULATORY_CARE_PROVIDER_SITE_OTHER): Payer: BC Managed Care – PPO | Admitting: Obstetrics and Gynecology

## 2011-10-16 ENCOUNTER — Telehealth: Payer: Self-pay | Admitting: Obstetrics and Gynecology

## 2011-10-16 VITALS — BP 90/60 | Resp 14 | Wt 256.0 lb

## 2011-10-16 DIAGNOSIS — F53 Postpartum depression: Secondary | ICD-10-CM

## 2011-10-16 DIAGNOSIS — IMO0002 Reserved for concepts with insufficient information to code with codable children: Secondary | ICD-10-CM
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
## 2011-10-16 NOTE — Telephone Encounter (Signed)
TC to pt. Given phone number for [facility]. Pt to call for appt and contact me with date.  Pt agreeable.

## 2011-10-16 NOTE — Telephone Encounter (Signed)
Message copied by Mason Jim on Wed Oct 16, 2011  4:30 PM ------      Message from: Cornelius Moras      Created: Wed Oct 16, 2011 12:52 PM       Please get patient referred to [facility]--pp 6 weeks, hx depression, on Zoloft, need long-term management of chronic issue.

## 2011-10-16 NOTE — Progress Notes (Signed)
Date of delivery: 08/25/11 Female Name: Bianca Strickland  Vaginal delivery:yes Cesarean section:no Tubal ligation:no GDM:no Breast Feeding:yes Bottle Feeding:yes Post-Partum Blues:yes Abnormal pap:no 12/2010 WNL Normal GU function: yes Normal GI function:yes Returning to work:Unsure  EPDS: 17  NO BC DESIRED AT THIS TIME

## 2011-10-17 ENCOUNTER — Telehealth: Payer: Self-pay | Admitting: Obstetrics and Gynecology

## 2011-10-17 NOTE — Telephone Encounter (Signed)
TC to pt.  States has appt at [facility] 10/23/11. States is doing OK now. Denies any suicidal or homicidal thoughts. To call with any concerns.

## 2012-01-07 ENCOUNTER — Ambulatory Visit: Payer: BC Managed Care – PPO | Admitting: Obstetrics and Gynecology

## 2012-01-28 ENCOUNTER — Other Ambulatory Visit: Payer: Self-pay | Admitting: Obstetrics and Gynecology

## 2012-01-28 ENCOUNTER — Encounter: Payer: Self-pay | Admitting: Obstetrics and Gynecology

## 2012-01-28 ENCOUNTER — Ambulatory Visit (INDEPENDENT_AMBULATORY_CARE_PROVIDER_SITE_OTHER): Payer: BC Managed Care – PPO | Admitting: Obstetrics and Gynecology

## 2012-01-28 VITALS — BP 108/60 | Temp 98.9°F | Wt 275.0 lb

## 2012-01-28 DIAGNOSIS — N898 Other specified noninflammatory disorders of vagina: Secondary | ICD-10-CM

## 2012-01-28 DIAGNOSIS — Z309 Encounter for contraceptive management, unspecified: Secondary | ICD-10-CM

## 2012-01-28 DIAGNOSIS — Z2089 Contact with and (suspected) exposure to other communicable diseases: Secondary | ICD-10-CM

## 2012-01-28 MED ORDER — METRONIDAZOLE 0.75 % VA GEL: 1.0000 | Freq: Every evening | VAGINAL | Status: DC | PRN

## 2012-01-28 MED ORDER — METRONIDAZOLE 0.75 % VA GEL: VAGINAL | Status: DC

## 2012-01-28 MED ORDER — DROSPIRENONE-ETHINYL ESTRADIOL 3-0.02 MG PO TABS: 1.0000 | ORAL_TABLET | Freq: Every day | ORAL | Status: DC

## 2012-01-28 NOTE — Addendum Note (Signed)
Addended by: Janeece Agee on: 01/28/2012 06:36 PM   Modules accepted: Orders

## 2012-01-28 NOTE — Progress Notes (Signed)
Color: tan Odor: yes Itching:no Thin:no Thick:yes Fever:no Dyspareunia:no Hx PID:no HX STD:yes Pelvic Pain:no Desires Gc/CT:yes Desires HIV,RPR,HbsAG:yes  GYN PROBLEM VISIT Subjective: [name] is a 36 y.o. year old female,G2P1011, who presents for a problem visit. She has a 6 wk hx of malodorous vaginal discharge without itching, burning or pain.  No dysuria Menses q27 days.  No IM bleeding   Objective:  BP 108/60  Temp 98.9 F (37.2 C) (Oral)  Wt 275 lb (124.739 kg)  LMP 01/03/2012   External genitalia: normal general appearance Vaginal: discharge, grey Cervix: normal appearance  Wet Prep:  PH=5.5, + whiff, + clue cells OSCOM BV: positive OSCOM TRICH: neg  Assessment: BV R/o STD  Plan: metrogel  GC/CHL/HIV/RPR/HBSAG Wants to restart BCPs used for many years.  Will restart Yaz on day#1 of menses F/U for aex   Dierdre Forth, MD  01/28/2012 4:58 PM

## 2012-01-29 LAB — HEPATITIS B SURFACE ANTIGEN: Hepatitis B Surface Ag: NEGATIVE

## 2012-02-01 LAB — GC/CHLAMYDIA PROBE AMP: GC Probe RNA: NEGATIVE

## 2012-03-26 ENCOUNTER — Encounter: Payer: Self-pay | Admitting: Obstetrics and Gynecology

## 2012-03-26 ENCOUNTER — Ambulatory Visit: Payer: BC Managed Care – PPO | Admitting: Obstetrics and Gynecology

## 2012-03-26 VITALS — BP 110/62 | Temp 98.5°F | Wt 274.0 lb

## 2012-03-26 DIAGNOSIS — IMO0001 Reserved for inherently not codable concepts without codable children: Secondary | ICD-10-CM

## 2012-03-26 LAB — POCT URINE PREGNANCY: Preg Test, Ur: NEGATIVE

## 2012-03-26 MED ORDER — DROSPIRENONE-ETHINYL ESTRADIOL 3-0.02 MG PO TABS: ORAL_TABLET | ORAL | Status: DC

## 2012-03-26 NOTE — Progress Notes (Signed)
36 YO on Loryna 3/0.02 (second pack) started spotting 2 weeks ago but had no problems with Helen Hashimoto (generic also).  Denies missed or late pills.  O: Pelvic: EGBUS-wnl, vagina-scant blood, uterus/adnexae-normal  UPT-negative  A: Unscheduled Bleeding on Changed Generic  P: Resume Gianvi (prescription sent with DAW order)      RTO-AEx in 3 months  Kymberly Blomberg, PA-C

## 2012-03-26 NOTE — Progress Notes (Signed)
When did bleeding start: CONSTANT  SINCE JAN How  Long: ABOUT 2 WEEKS;LIGHT BLEEDING How often changing pad/tampon: PAST 3 DAYS;THREE X A DAY AND BEFORE JUST ONE PAD A DAY Bleeding Disorders: no Cramping: no Contraception: yes Fibroids: no Hormone Therapy: no New Medications: no Menopausal Symptoms: no Vag. Discharge: no Abdominal Pain: no Increased Stress: yes WORK

## 2012-11-17 ENCOUNTER — Emergency Department (HOSPITAL_COMMUNITY)
Admission: EM | Admit: 2012-11-17 | Discharge: 2012-11-17 | Disposition: A | Discharge status: Home / Self Care | Payer: BC Managed Care – PPO | Attending: Emergency Medicine | Admitting: Emergency Medicine

## 2012-11-17 ENCOUNTER — Encounter (HOSPITAL_COMMUNITY): Payer: Self-pay

## 2012-11-17 ENCOUNTER — Emergency Department (HOSPITAL_COMMUNITY): Payer: BC Managed Care – PPO

## 2012-11-17 DIAGNOSIS — N926 Irregular menstruation, unspecified: Secondary | ICD-10-CM | POA: Insufficient documentation

## 2012-11-17 DIAGNOSIS — Z8639 Personal history of other endocrine, nutritional and metabolic disease: Secondary | ICD-10-CM | POA: Insufficient documentation

## 2012-11-17 DIAGNOSIS — Z9884 Bariatric surgery status: Secondary | ICD-10-CM | POA: Insufficient documentation

## 2012-11-17 DIAGNOSIS — R079 Chest pain, unspecified: Secondary | ICD-10-CM

## 2012-11-17 DIAGNOSIS — Z79899 Other long term (current) drug therapy: Secondary | ICD-10-CM | POA: Insufficient documentation

## 2012-11-17 DIAGNOSIS — F3289 Other specified depressive episodes: Secondary | ICD-10-CM | POA: Insufficient documentation

## 2012-11-17 DIAGNOSIS — F329 Major depressive disorder, single episode, unspecified: Secondary | ICD-10-CM | POA: Insufficient documentation

## 2012-11-17 DIAGNOSIS — Z791 Long term (current) use of non-steroidal anti-inflammatories (NSAID): Secondary | ICD-10-CM | POA: Insufficient documentation

## 2012-11-17 DIAGNOSIS — Z8619 Personal history of other infectious and parasitic diseases: Secondary | ICD-10-CM | POA: Insufficient documentation

## 2012-11-17 HISTORY — DX: Sleep apnea, unspecified: G47.30

## 2012-11-17 LAB — CBC WITH DIFFERENTIAL/PLATELET
Basophils Absolute: 0 10*3/uL (ref 0.0–0.1)
Basophils Relative: 1 % (ref 0–1)
Eosinophils Absolute: 0.1 10*3/uL (ref 0.0–0.7)
Eosinophils Relative: 2 % (ref 0–5)
HCT: 33.1 % — ABNORMAL LOW (ref 36.0–46.0)
Hemoglobin: 10.3 g/dL — ABNORMAL LOW (ref 12.0–15.0)
Lymphocytes Relative: 26 % (ref 12–46)
Lymphs Abs: 1.4 10*3/uL (ref 0.7–4.0)
MCH: 22.8 pg — ABNORMAL LOW (ref 26.0–34.0)
MCHC: 31.1 g/dL (ref 30.0–36.0)
MCV: 73.2 fL — ABNORMAL LOW (ref 78.0–100.0)
Monocytes Absolute: 0.3 10*3/uL (ref 0.1–1.0)
Monocytes Relative: 6 % (ref 3–12)
Neutro Abs: 3.5 10*3/uL (ref 1.7–7.7)
Neutrophils Relative %: 66 % (ref 43–77)
Platelets: 295 10*3/uL (ref 150–400)
RBC: 4.52 MIL/uL (ref 3.87–5.11)
RDW: 16.4 % — ABNORMAL HIGH (ref 11.5–15.5)
WBC: 5.3 10*3/uL (ref 4.0–10.5)

## 2012-11-17 LAB — BASIC METABOLIC PANEL
BUN: 19 mg/dL (ref 6–23)
CO2: 22 mEq/L (ref 19–32)
Calcium: 9 mg/dL (ref 8.4–10.5)
Chloride: 102 mEq/L (ref 96–112)
Creatinine, Ser: 0.82 mg/dL (ref 0.50–1.10)
GFR calc Af Amer: >90 mL/min (ref >90)
GFR calc non Af Amer: 90 mL/min — ABNORMAL LOW (ref >90)
Glucose, Bld: 119 mg/dL — ABNORMAL HIGH (ref 70–99)
Potassium: 3.6 mEq/L (ref 3.5–5.1)
Sodium: 136 mEq/L (ref 135–145)

## 2012-11-17 LAB — TROPONIN I: Troponin I: <0.3 ng/mL (ref <0.30)

## 2012-11-17 NOTE — ED Notes (Signed)
Pt c/o sharp chest pains to lt chest x1wk 2x's a day lasting a few seconds; denies n/v/sob/diaphoresis, c/o fullness in chest this morning
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
## 2013-05-24 ENCOUNTER — Ambulatory Visit: Payer: Self-pay | Admitting: Bariatrics

## 2013-06-11 ENCOUNTER — Ambulatory Visit: Payer: Self-pay | Admitting: Bariatrics

## 2013-07-08 ENCOUNTER — Ambulatory Visit: Payer: Self-pay | Admitting: Bariatrics

## 2013-07-19 ENCOUNTER — Ambulatory Visit: Payer: Self-pay | Admitting: Bariatrics

## 2013-09-20 ENCOUNTER — Encounter (HOSPITAL_COMMUNITY): Payer: Self-pay | Admitting: Emergency Medicine

## 2013-09-20 ENCOUNTER — Inpatient Hospital Stay (HOSPITAL_COMMUNITY)
Admission: RE | Admit: 2013-09-20 | Discharge: 2013-09-24 | DRG: 885 | Disposition: A | Discharge status: Home / Self Care | Payer: BC Managed Care – PPO | Attending: Psychiatry | Admitting: Psychiatry

## 2013-09-20 DIAGNOSIS — F411 Generalized anxiety disorder: Secondary | ICD-10-CM | POA: Diagnosis present

## 2013-09-20 DIAGNOSIS — R45851 Suicidal ideations: Secondary | ICD-10-CM

## 2013-09-20 DIAGNOSIS — Z8249 Family history of ischemic heart disease and other diseases of the circulatory system: Secondary | ICD-10-CM | POA: Diagnosis not present

## 2013-09-20 DIAGNOSIS — F332 Major depressive disorder, recurrent severe without psychotic features: Principal | ICD-10-CM | POA: Diagnosis present

## 2013-09-20 DIAGNOSIS — Z9884 Bariatric surgery status: Secondary | ICD-10-CM | POA: Diagnosis not present

## 2013-09-20 DIAGNOSIS — G4730 Sleep apnea, unspecified: Secondary | ICD-10-CM

## 2013-09-20 DIAGNOSIS — K219 Gastro-esophageal reflux disease without esophagitis: Secondary | ICD-10-CM | POA: Diagnosis present

## 2013-09-20 DIAGNOSIS — Z6379 Other stressful life events affecting family and household: Secondary | ICD-10-CM

## 2013-09-20 DIAGNOSIS — F32A Depression, unspecified: Secondary | ICD-10-CM

## 2013-09-20 DIAGNOSIS — Z833 Family history of diabetes mellitus: Secondary | ICD-10-CM | POA: Diagnosis not present

## 2013-09-20 DIAGNOSIS — G4700 Insomnia, unspecified: Secondary | ICD-10-CM | POA: Diagnosis present

## 2013-09-20 DIAGNOSIS — F329 Major depressive disorder, single episode, unspecified: Secondary | ICD-10-CM

## 2013-09-20 DIAGNOSIS — Z91013 Allergy to seafood: Secondary | ICD-10-CM

## 2013-09-20 MED ORDER — DROSPIRENONE-ETHINYL ESTRADIOL 3-0.02 MG PO TABS
1.0000 | ORAL_TABLET | Freq: Every day | ORAL | Status: DC
  Administered 2013-09-21: 1 via ORAL
  Filled 2013-09-20 (×2): qty 1

## 2013-09-20 MED ORDER — ALUM & MAG HYDROXIDE-SIMETH 200-200-20 MG/5ML PO SUSP: 30.0000 mL | ORAL | Status: DC | PRN

## 2013-09-20 MED ORDER — PANTOPRAZOLE SODIUM 40 MG PO TBEC
40.0000 mg | DELAYED_RELEASE_TABLET | Freq: Every day | ORAL | Status: DC
Start: 2013-09-21 — End: 2013-09-24
  Administered 2013-09-21 – 2013-09-24 (×4): 40 mg via ORAL
  Filled 2013-09-20 (×7): qty 1

## 2013-09-20 MED ORDER — SERTRALINE HCL 50 MG PO TABS
50.0000 mg | ORAL_TABLET | Freq: Every day | ORAL | Status: DC
  Administered 2013-09-21: 50 mg via ORAL
  Filled 2013-09-20 (×3): qty 1

## 2013-09-20 MED ORDER — MAGNESIUM HYDROXIDE 400 MG/5ML PO SUSP: 30.0000 mL | Freq: Every day | ORAL | Status: DC | PRN

## 2013-09-20 MED ORDER — ACETAMINOPHEN 325 MG PO TABS: 650.0000 mg | ORAL_TABLET | Freq: Four times a day (QID) | ORAL | Status: DC | PRN

## 2013-09-20 MED ORDER — TRAZODONE HCL 50 MG PO TABS
50.0000 mg | ORAL_TABLET | Freq: Every evening | ORAL | Status: DC | PRN
  Administered 2013-09-20 – 2013-09-23 (×4): 50 mg via ORAL
  Filled 2013-09-20 (×5): qty 1
  Filled 2013-09-20 (×2): qty 8
  Filled 2013-09-20 (×4): qty 1

## 2013-09-20 MED ORDER — HYDROXYZINE HCL 25 MG PO TABS: 25.0000 mg | ORAL_TABLET | Freq: Four times a day (QID) | ORAL | Status: DC | PRN

## 2013-09-20 NOTE — Tx Team (Signed)
Initial Interdisciplinary Treatment Plan   PATIENT STRESSORS: Financial difficulties Marital or family conflict Occupational concerns   PROBLEM LIST: Problem List/Patient Goals Date to be addressed Date deferred Reason deferred Estimated date of resolution                                                         DISCHARGE CRITERIA:  Improved stabilization in mood, thinking, and/or behavior  PRELIMINARY DISCHARGE PLAN: Outpatient therapy  PATIENT/FAMIILY INVOLVEMENT: This treatment plan has been presented to and reviewed with the patient, Bianca Strickland, and/or family member.  The patient and family have been given the opportunity to ask questions and make suggestions.  Bianca Strickland [facility] 09/20/2013, 9:20 PM
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
## 2013-09-20 NOTE — Progress Notes (Addendum)
Patient ID: Bianca Strickland, female   DOB: [birth year], 38 y.o.   MRN: [id number] Pt denies HI/AVH, however pt does endorse SI with a plan to OD.  Pt agrees to contract. Pt states, "I just want to die.  Things just keep piling up."  Pt states that her current employer told her she was "worthless" and that she added "no value to the team."  Pt is worried about losing her job.  Pt also stressed about her finances.  She states that she is constantly getting calls from bill collectors.  She is also concerned about her 58 year old daughter.  She states she sent her daughter to [place] to stay with her step mother on the 4th of July until 8/6. Pt states she sent her because her daughters day care closes for the month of July and pt could not afford to place her daughter with any other daycare.  Pt states that because of sending daughter to [place], she has received a lot of criticism from family and friends.  They told her she was "selfish" and that she was "emotionally destroying" her daughter.  Pt tearful on admission Pt states, "If I lose my job I will lose my daughter because I wouldn't be able to take care of her then." Pt has past suicide attempt at age 78 by cutting.  Pt reports all of her family living in [place].  She too wants to move back there, however she must find a job in [place] first.

## 2013-09-21 DIAGNOSIS — F332 Major depressive disorder, recurrent severe without psychotic features: Principal | ICD-10-CM

## 2013-09-21 DIAGNOSIS — R45851 Suicidal ideations: Secondary | ICD-10-CM

## 2013-09-21 LAB — HEPATIC FUNCTION PANEL
ALBUMIN: 3.1 g/dL — AB (ref 3.5–5.2)
ALK PHOS: 73 U/L (ref 39–117)
ALT: 11 U/L (ref 0–35)
AST: 14 U/L (ref 0–37)
BILIRUBIN TOTAL: 0.6 mg/dL (ref 0.3–1.2)
TOTAL PROTEIN: 7.7 g/dL (ref 6.0–8.3)

## 2013-09-21 LAB — CBC
HCT: 38.4 % (ref 36.0–46.0)
HEMOGLOBIN: 12.4 g/dL (ref 12.0–15.0)
MCH: 24.6 pg — AB (ref 26.0–34.0)
MCHC: 32.3 g/dL (ref 30.0–36.0)
MCV: 76.2 fL — ABNORMAL LOW (ref 78.0–100.0)
Platelets: 324 10*3/uL (ref 150–400)
RBC: 5.04 MIL/uL (ref 3.87–5.11)
RDW: 18.1 % — ABNORMAL HIGH (ref 11.5–15.5)
WBC: 5.8 10*3/uL (ref 4.0–10.5)

## 2013-09-21 LAB — COMPREHENSIVE METABOLIC PANEL
ALT: 11 U/L (ref 0–35)
ANION GAP: 13 (ref 5–15)
AST: 14 U/L (ref 0–37)
Albumin: 3.1 g/dL — ABNORMAL LOW (ref 3.5–5.2)
Alkaline Phosphatase: 73 U/L (ref 39–117)
BUN: 13 mg/dL (ref 6–23)
CO2: 23 meq/L (ref 19–32)
Calcium: 9.6 mg/dL (ref 8.4–10.5)
Chloride: 102 mEq/L (ref 96–112)
Creatinine, Ser: 0.84 mg/dL (ref 0.50–1.10)
GFR, EST NON AFRICAN AMERICAN: 86 mL/min — AB (ref >90)
GLUCOSE: 100 mg/dL — AB (ref 70–99)
Potassium: 4.3 mEq/L (ref 3.7–5.3)
Sodium: 138 mEq/L (ref 137–147)
Total Bilirubin: 0.6 mg/dL (ref 0.3–1.2)
Total Protein: 7.8 g/dL (ref 6.0–8.3)

## 2013-09-21 LAB — PREGNANCY, URINE: PREG TEST UR: NEGATIVE

## 2013-09-21 LAB — URINALYSIS, ROUTINE W REFLEX MICROSCOPIC
Bilirubin Urine: NEGATIVE
Glucose, UA: NEGATIVE mg/dL
HGB URINE DIPSTICK: NEGATIVE
KETONES UR: NEGATIVE mg/dL
Nitrite: NEGATIVE
PROTEIN: NEGATIVE mg/dL
Specific Gravity, Urine: 1.03 (ref 1.005–1.030)
UROBILINOGEN UA: 0.2 mg/dL (ref 0.0–1.0)
pH: 5.5 (ref 5.0–8.0)

## 2013-09-21 LAB — LIPID PANEL
Cholesterol: 220 mg/dL — ABNORMAL HIGH (ref 0–200)
HDL: 89 mg/dL (ref >39)
LDL Cholesterol: 101 mg/dL — ABNORMAL HIGH (ref 0–99)
Total CHOL/HDL Ratio: 2.5 RATIO
Triglycerides: 151 mg/dL — ABNORMAL HIGH (ref <150)
VLDL: 30 mg/dL (ref 0–40)

## 2013-09-21 LAB — URINE MICROSCOPIC-ADD ON

## 2013-09-21 LAB — RAPID URINE DRUG SCREEN, HOSP PERFORMED
Amphetamines: NOT DETECTED
BARBITURATES: NOT DETECTED
BENZODIAZEPINES: NOT DETECTED
Cocaine: NOT DETECTED
Opiates: NOT DETECTED
Tetrahydrocannabinol: NOT DETECTED

## 2013-09-21 LAB — TSH: TSH: 4.71 u[IU]/mL — ABNORMAL HIGH (ref 0.350–4.500)

## 2013-09-21 LAB — HEMOGLOBIN A1C
Hgb A1c MFr Bld: 6 % — ABNORMAL HIGH (ref <5.7)
MEAN PLASMA GLUCOSE: 126 mg/dL — AB (ref <117)

## 2013-09-21 MED ORDER — DROSPIRENONE-ETHINYL ESTRADIOL 3-0.02 MG PO TABS
1.0000 | ORAL_TABLET | Freq: Every day | ORAL | Status: DC
  Administered 2013-09-22 – 2013-09-24 (×3): 1 via ORAL

## 2013-09-21 MED ORDER — ESCITALOPRAM OXALATE 10 MG PO TABS
10.0000 mg | ORAL_TABLET | Freq: Every day | ORAL | Status: DC
  Administered 2013-09-22 – 2013-09-24 (×3): 10 mg via ORAL
  Filled 2013-09-21 (×3): qty 1
  Filled 2013-09-21: qty 4
  Filled 2013-09-21 (×2): qty 1
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
## 2013-09-21 NOTE — Progress Notes (Addendum)
Adult Psychoeducational Group Note  Date:  09/21/2013 Time:  9:38 PM  Group Topic/Focus:  Wrap-Up Group:   The focus of this group is to help patients review their daily goal of treatment and discuss progress on daily workbooks.  Participation Level:  Active  Participation Quality:  Appropriate  Affect:  Appropriate  Cognitive:  Appropriate  Insight: Appropriate  Engagement in Group:  Engaged  Modes of Intervention:  Discussion  Additional Comments:  Goal was to not cry today so she stayed out of her room today and she did that by interacting with peers in the day room per patient. She also presented with a smile on her face and was encouraged to continue smiling.  Mady Gemma Latrice 09/21/2013, 9:38 PM

## 2013-09-21 NOTE — Progress Notes (Signed)
The focus of this group is to educate the patient on the purpose and policies of crisis stabilization and provide a format to answer questions about their admission.  The group details unit policies and expectations of patients while admitted.Patient attended group and participated in exercises.  She is new this am and she was attentive as others shared goals.
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
## 2013-09-21 NOTE — Progress Notes (Signed)
Recreation Therapy Notes  Animal-Assisted Activity/Therapy (AAA/T) Program Checklist/Progress Notes Patient Eligibility Criteria Checklist & Daily Group note for Rec Tx Intervention  Date: 08.04.2015 Time: 3:15pm Location: [address]   AAA/T Program Assumption of Risk Form signed by Patient/ or Parent Legal Guardian yes  Patient is free of allergies or sever asthma yes  Patient reports no fear of animals yes  Patient reports no history of cruelty to animals yes   Patient understands his/her participation is voluntary yes  Patient washes hands before animal contact yes  Patient washes hands after animal contact yes  Behavioral Response: Engaged, Appropriate   Education: Hand Washing, Appropriate Animal Interaction   Education Outcome: Acknowledges understanding  Clinical Observations/Feedback: Patient interacted appropriately with therapy dog team and peers in session.   Laureen Ochs Bianca Strickland, LRT/CTRS  Bianca Strickland L 09/21/2013 4:11 PM

## 2013-09-21 NOTE — Progress Notes (Signed)
Patient ID: Bianca Strickland, female   DOB: [birth year], 38 y.o.   MRN: [id number] D-Patient reports she slept well and her appetite is fair.  Her energy level is normal and her concentration is good.  She is rating depression and hopelessness at 7/10 and anxiety at 5/10.  She denies thoughts of self harm.  Her affect is dad and her affect seems constricted.  A- She is focused on going home as her 33 year old daughter will be coning home Thursday. A - Talked with her about recovery and thinking about her assets and strengths and what she wants for herself.  Patient is thoughtful and says she will thing about this.  She did give her key to a friend to bring her birth control pills and clothes.  She is attending groups and interacting a little with peers.

## 2013-09-21 NOTE — Progress Notes (Signed)
D: Patient in the dayroom on approach. Patient states she is worried about discharged because she states her daughter is coming home Thursday.  Patient states her goal was to have a better outlook on things.  Patient states she continue to be sad.  Patient  Denies SI/HI and denies AVH. A: Staff to monitor Q 15 mins for safety.  Encouragement and support offered.  Scheduled medications administered per orders. R: Patient remains safe on the unit.  Patient attended group tonight.  Patient visible on the unit and interacting with peers.  Patient taking administered medications
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
## 2013-09-21 NOTE — BHH Group Notes (Addendum)
[place] LCSW Group Therapy      Feelings About Diagnosis 1:15 - 2:30 PM         09/21/2013    Type of Therapy:  Group Therapy  Participation Level:  Active  Participation Quality:  Appropriate  Affect:  Appropriate  Cognitive:  Alert and Appropriate  Insight:  Developing/Improving and Engaged  Engagement in Therapy:  Developing/Improving and Engaged  Modes of Intervention:  Discussion, Education, Exploration, Problem-Solving, Rapport Building, Support  Summary of Progress/Problems:  Patient actively participated in group. Patient discussed past and present diagnosis and the effects it has had on  life.  Patient talked about family and society being judgmental and the stigma associated with having a mental health diagnosis.  Patient shared having a mental have diagnosis leads her to isolate due to others not understanding what she is going through.  [name] 09/21/2013
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
## 2013-09-22 DIAGNOSIS — F329 Major depressive disorder, single episode, unspecified: Secondary | ICD-10-CM

## 2013-09-22 LAB — RPR

## 2013-09-22 LAB — HIV ANTIBODY (ROUTINE TESTING W REFLEX): HIV 1&2 Ab, 4th Generation: NONREACTIVE

## 2013-09-22 LAB — TSH: TSH: 3.49 u[IU]/mL (ref 0.350–4.500)

## 2013-09-22 LAB — VITAMIN B12: Vitamin B-12: 980 pg/mL — ABNORMAL HIGH (ref 211–911)

## 2013-09-22 NOTE — Progress Notes (Signed)
[facility] MD Progress Note  09/22/2013 2:03 PM Bianca Strickland  MRN:  [id number] Subjective:  Patient states she is feeling better.  Objective: Patient feels less depressed, less sad, and states she feels she is benefiting from group therapies and medication regimen. She is focused on being discharged soon, as her stepmother will be bringing her daughter back from [place] tomorrow afternoon. At this time she is future oriented, and states she plans to take some time off work ( by getting FMLA from work) in order to be able to be with her daughter and " take better care of myself". She  Has had some mild nausea, but no vomiting, which may be related to Lexapro, but at this time agrees to continue medication trial.  She has been going to groups, and is participative in milieu. Labs reviewed.  We reviewed abnormal Hgb A1C. TSH, B12  Normal.  Diagnosis:  MDD   Total Time spent with patient: 25 minutes  ADL's:  improving  Sleep: Improved   Appetite:  Improved   Suicidal Ideation:  Denies any suicidal ideations Homicidal Ideation:  Denies any homicidal ideations AEB (as evidenced by):  Psychiatric Specialty Exam: Physical Exam  Review of Systems  Respiratory: Negative for cough and shortness of breath.   Cardiovascular: Negative for chest pain.  Gastrointestinal: Positive for nausea. Negative for heartburn, vomiting, abdominal pain and diarrhea.    Blood pressure 106/73, pulse 117, temperature 98.4 F (36.9 C), temperature source Oral, resp. rate 20, height 5' 8"  (1.727 m), weight 142.883 kg (315 lb), last menstrual period 08/30/2013.Body mass index is 47.91 kg/(m^2).  General Appearance: improved grooming  Eye Contact::  Good  Speech:  Normal Rate  Volume:  Normal  Mood:  depressed but improved  Affect:  less constricted, more reactive   Thought Process:  Goal Directed and Linear  Orientation:  NA- fully alert and attentive  Thought Content:  no hallucinations, no delusions  Suicidal  Thoughts:  No- at this time not suicidal or homicidal ideations  Homicidal Thoughts:  No  Memory:  NA  Judgement:  Good  Insight:  Fair  Psychomotor Activity:  Normal  Concentration:  Good  Recall:  Good  Fund of Knowledge:Good  Language: Good  Akathisia:  Negative  Handed:  Right  AIMS (if indicated):     Assets:  Communication Skills Desire for Improvement Resilience  Sleep:  Number of Hours: 6.5   Musculoskeletal: Strength & Muscle Tone: within normal limits Gait & Station: normal Patient leans: N/A  Current Medications: Current Facility-Administered Medications  Medication Dose Route Frequency Provider Last Rate Last Dose  . acetaminophen (TYLENOL) tablet 650 mg  650 mg Oral Q6H PRN Laverle Hobby, PA-C      . alum & mag hydroxide-simeth (MAALOX/MYLANTA) 200-200-20 MG/5ML suspension 30 mL  30 mL Oral Q4H PRN Laverle Hobby, PA-C      . drospirenone-ethinyl estradiol Sherrill Raring) 3-0.02 MG per tablet 1 tablet  1 tablet Oral Daily Neita Garnet, MD   1 tablet at 09/22/13 [phone]  . escitalopram (LEXAPRO) tablet 10 mg  10 mg Oral Daily Neita Garnet, MD   10 mg at 09/22/13 6203  . hydrOXYzine (ATARAX/VISTARIL) tablet 25 mg  25 mg Oral Q6H PRN Laverle Hobby, PA-C      . magnesium hydroxide (MILK OF MAGNESIA) suspension 30 mL  30 mL Oral Daily PRN Laverle Hobby, PA-C      . pantoprazole (PROTONIX) EC tablet 40 mg  40 mg Oral Daily  Laverle Hobby, PA-C   40 mg at 09/22/13 9562  . traZODone (DESYREL) tablet 50 mg  50 mg Oral QHS,MR X 1 Laverle Hobby, PA-C   50 mg at 09/21/13 2158    Lab Results:  Results for orders placed during the hospital encounter of 09/20/13 (from the past 48 hour(s))  CBC     Status: Abnormal   Collection Time    09/21/13  6:20 AM      Result Value Ref Range   WBC 5.8  4.0 - 10.5 K/uL   RBC 5.04  3.87 - 5.11 MIL/uL   Hemoglobin 12.4  12.0 - 15.0 g/dL   HCT 38.4  36.0 - 46.0 %   MCV 76.2 (*) 78.0 - 100.0 fL   MCH 24.6 (*) 26.0 - 34.0 pg    MCHC 32.3  30.0 - 36.0 g/dL   RDW 18.1 (*) 11.5 - 15.5 %   Platelets 324  150 - 400 K/uL   Comment: Performed at [place] PANEL     Status: Abnormal   Collection Time    09/21/13  6:20 AM      Result Value Ref Range   Sodium 138  137 - 147 mEq/L   Potassium 4.3  3.7 - 5.3 mEq/L   Chloride 102  96 - 112 mEq/L   CO2 23  19 - 32 mEq/L   Glucose, Bld 100 (*) 70 - 99 mg/dL   BUN 13  6 - 23 mg/dL   Creatinine, Ser 0.84  0.50 - 1.10 mg/dL   Calcium 9.6  8.4 - 10.5 mg/dL   Total Protein 7.8  6.0 - 8.3 g/dL   Albumin 3.1 (*) 3.5 - 5.2 g/dL   AST 14  0 - 37 U/L   ALT 11  0 - 35 U/L   Alkaline Phosphatase 73  39 - 117 U/L   Total Bilirubin 0.6  0.3 - 1.2 mg/dL   GFR calc non Af Amer 86 (*) >90 mL/min   GFR calc Af Amer >90  >90 mL/min   Comment: (NOTE)     The eGFR has been calculated using the CKD EPI equation.     This calculation has not been validated in all clinical situations.     eGFR's persistently <90 mL/min signify possible Chronic Kidney     Disease.   Anion gap 13  5 - 15   Comment: Performed at [facility]     Status: Abnormal   Collection Time    09/21/13  6:20 AM      Result Value Ref Range   Hemoglobin A1C 6.0 (*) <5.7 %   Comment: (NOTE)                                                                               According to the ADA Clinical Practice Recommendations for 2011, when     HbA1c is used as a screening test:      >=6.5%   Diagnostic of Diabetes Mellitus               (if abnormal result is confirmed)  5.7-6.4%   Increased risk of developing Diabetes Mellitus     References:Diagnosis and Classification of Diabetes Mellitus,Diabetes     RSWN,4627,03(JKKXF 1):S62-S69 and Standards of Medical Care in             Diabetes - 2011,Diabetes GHWE,9937,16 (Suppl 1):S11-S61.   Mean Plasma Glucose 126 (*) <117 mg/dL   Comment: Performed at [place]      Status: Abnormal   Collection Time    09/21/13  6:20 AM      Result Value Ref Range   Cholesterol 220 (*) 0 - 200 mg/dL   Triglycerides 151 (*) <150 mg/dL   HDL 89  >39 mg/dL   Total CHOL/HDL Ratio 2.5     VLDL 30  0 - 40 mg/dL   LDL Cholesterol 101 (*) 0 - 99 mg/dL   Comment:            Total Cholesterol/HDL:CHD Risk     Coronary Heart Disease Risk Table                         Men   Women      1/2 Average Risk   3.4   3.3      Average Risk       5.0   4.4      2 X Average Risk   9.6   7.1      3 X Average Risk  23.4   11.0                Use the calculated Patient Ratio     above and the CHD Risk Table     to determine the patient's CHD Risk.                ATP III CLASSIFICATION (LDL):      <100     mg/dL   Optimal      100-129  mg/dL   Near or Above                        Optimal      130-159  mg/dL   Borderline      160-189  mg/dL   High      >190     mg/dL   Very High     Performed at [place] PANEL     Status: Abnormal   Collection Time    09/21/13  6:20 AM      Result Value Ref Range   Total Protein 7.7  6.0 - 8.3 g/dL   Albumin 3.1 (*) 3.5 - 5.2 g/dL   AST 14  0 - 37 U/L   ALT 11  0 - 35 U/L   Alkaline Phosphatase 73  39 - 117 U/L   Total Bilirubin 0.6  0.3 - 1.2 mg/dL   Bilirubin, Direct <0.2  0.0 - 0.3 mg/dL   Indirect Bilirubin NOT CALCULATED  0.3 - 0.9 mg/dL   Comment: Performed at [facility]  TSH     Status: Abnormal   Collection Time    09/21/13  6:20 AM      Result Value Ref Range   TSH 4.710 (*) 0.350 - 4.500 uIU/mL   Comment: Performed at [place] MICROSCOPIC     Status: Abnormal   Collection Time    09/21/13  6:37  AM      Result Value Ref Range   Color, Urine YELLOW  YELLOW   APPearance CLOUDY (*) CLEAR   Specific Gravity, Urine 1.030  1.005 - 1.030   pH 5.5  5.0 - 8.0   Glucose, UA NEGATIVE  NEGATIVE mg/dL   Hgb urine dipstick NEGATIVE  NEGATIVE    Bilirubin Urine NEGATIVE  NEGATIVE   Ketones, ur NEGATIVE  NEGATIVE mg/dL   Protein, ur NEGATIVE  NEGATIVE mg/dL   Urobilinogen, UA 0.2  0.0 - 1.0 mg/dL   Nitrite NEGATIVE  NEGATIVE   Leukocytes, UA MODERATE (*) NEGATIVE   Comment: Performed at [place], URINE     Status: None   Collection Time    09/21/13  6:37 AM      Result Value Ref Range   Preg Test, Ur NEGATIVE  NEGATIVE   Comment:            THE SENSITIVITY OF THIS     METHODOLOGY IS >20 mIU/mL.     Performed at [place] (HOSP PERFORMED)     Status: None   Collection Time    09/21/13  6:37 AM      Result Value Ref Range   Opiates NONE DETECTED  NONE DETECTED   Cocaine NONE DETECTED  NONE DETECTED   Benzodiazepines NONE DETECTED  NONE DETECTED   Amphetamines NONE DETECTED  NONE DETECTED   Tetrahydrocannabinol NONE DETECTED  NONE DETECTED   Barbiturates NONE DETECTED  NONE DETECTED   Comment:            DRUG SCREEN FOR MEDICAL PURPOSES     ONLY.  IF CONFIRMATION IS NEEDED     FOR ANY PURPOSE, NOTIFY LAB     WITHIN 5 DAYS.                LOWEST DETECTABLE LIMITS     FOR URINE DRUG SCREEN     Drug Class       Cutoff (ng/mL)     Amphetamine      1000     Barbiturate      200     Benzodiazepine   509     Tricyclics       326     Opiates          300     Cocaine          300     THC              50     Performed at [place] ON     Status: Abnormal   Collection Time    09/21/13  6:37 AM      Result Value Ref Range   Squamous Epithelial / LPF FEW (*) RARE   WBC, UA 3-6  <3 WBC/hpf   RBC / HPF 0-2  <3 RBC/hpf   Bacteria, UA MANY (*) RARE   Urine-Other MUCOUS PRESENT     Comment: Performed at [facility]  RPR     Status: None   Collection Time    09/22/13  6:30 AM      Result Value Ref Range   RPR NON REAC  NON REAC   Comment: Performed at [place]     Status: Abnormal   Collection Time    09/22/13  6:30 AM  Result Value Ref Range   Vitamin B-12 980 (*) 211 - 911 pg/mL   Comment: Performed at [facility]  TSH     Status: None   Collection Time    09/22/13  6:30 AM      Result Value Ref Range   TSH 3.490  0.350 - 4.500 uIU/mL   Comment: Performed at [facility]    Physical Findings: AIMS: Facial and Oral Movements Muscles of Facial Expression: None, normal Lips and Perioral Area: None, normal Jaw: None, normal Tongue: None, normal,Extremity Movements Upper (arms, wrists, hands, fingers): None, normal Lower (legs, knees, ankles, toes): None, normal, Trunk Movements Neck, shoulders, hips: None, normal, Overall Severity Severity of abnormal movements (highest score from questions above): None, normal Incapacitation due to abnormal movements: None, normal Patient's awareness of abnormal movements (rate only patient's report): No Awareness, Dental Status Current problems with teeth and/or dentures?: No Does patient usually wear dentures?: No  CIWA:  CIWA-Ar Total: 0 COWS:     Assessment: Patient is improving, and at this time has a partially improved mood, although still has a somewhat constricted affect, and no current SI . She is hoping to discharge soon as she states her daughter is coming back home soon. She is participating in milieu, seems invested in treatment, and is tolerating Lexapro well, except for mild nausea.   Treatment Plan Summary: Daily contact with patient to assess and evaluate symptoms and progress in treatment Medication management See below  Plan: Continue inpatient treatment/support. Continue Lexapro 10 mgrs QDAY. Patient is interested in IOP as a potential disposition option if possible.  Medical Decision Making Problem Points:  Established problem, stable/improving (1), Review of last therapy session (1) and Review of psycho-social stressors (1) Data Points:  Review or  order clinical lab tests (1) Review of medication regiment & side effects (2)  I certify that inpatient services furnished can reasonably be expected to improve the patient's condition.   COBOS, FERNANDO 09/22/2013, 2:03 PM

## 2013-09-22 NOTE — Progress Notes (Signed)
Pt has been up and active in the milieu today.  She has been attending groups and interacting with peers and staff appropriately.  She rated all her depression, hopelessness and anxiety a 5 on her self-inventory.  She denies any S/H ideation or A/V/H.  Her goal today "work on my self-esteem and stop focusing on the negative" she plans to "stop thinking about bad things and worrying" she hopes to obtain coping skills from groups.

## 2013-09-22 NOTE — BHH Group Notes (Signed)
[place] LCSW Group Therapy  Emotional Regulation 1:15 - 2: 30 PM        09/22/2013     Type of Therapy:  Group Therapy  Participation Level:  Appropriate  Participation Quality:  Appropriate  Affect:  Appropriate  Cognitive:  Attentive Appropriate  Insight:  Developing/Improving Engaged  Engagement in Therapy:  Developing/Improving Engaged  Modes of Intervention:  Discussion Exploration Problem-Solving Supportive  Summary of Progress/Problems:  Group topic was emotional regulations.  Patient participated in the discussion and was able to identify an emotion that needed to regulated. Patient shared she has to learn to better control frustration.  She talked about problems with work and being a single parent.  Patient was able to identify approprite coping skills.  [name] 09/22/2013

## 2013-09-22 NOTE — Progress Notes (Signed)
D: Patient in the dayroom on first approach.  Patient brighter today and she states she had a good day.  Patient states she has learned that she has to deal with her depression.  Patient states her 40 year old daughter is her driving force.  Patient denies SI/HI and denies AVH. A: Staff to monitor Q 15 mins for safety.  Encouragement and support offered.  Scheduled medications administered per orders. R: Patient remains safe on the unit.  Patient attended group tonight.  Patient visible on the unit and interacting with peers.  Patient taking administered medications.

## 2013-09-22 NOTE — BHH Group Notes (Signed)
[facility] LCSW Aftercare Discharge Planning Group Note   09/22/2013 11:31 AM    Participation Quality:  Appropraite  Mood/Affect:  Appropriate  Depression Rating:  3  Anxiety Rating:  2  Thoughts of Suicide:  No  Will you contract for safety?   NA  Current AVH:  No  Plan for Discharge/Comments:  Patient attended discharge planning group and actively participated in group.  She will continue outpatient services with [facility].  CSW provided all participants with daily workbook.   Transportation Means: Patient has transportation.   Supports:  Patient has a support system.   Bianca Strickland, Eulas Post

## 2013-09-22 NOTE — BHH Suicide Risk Assessment (Signed)
[place] INPATIENT:  Family/Significant Other Suicide Prevention Education  Suicide Prevention Education:  Education Completed; Joseph Berkshire, Mother, [phone]; has been identified by the patient as the family member/significant other with whom the patient will be residing, and identified as the person(s) who will aid the patient in the event of a mental health crisis (suicidal ideations/suicide attempt).  With written consent from the patient, the family member/significant other has been provided the following suicide prevention education, prior to the and/or following the discharge of the patient.  The suicide prevention education provided includes the following:  Suicide risk factors  Suicide prevention and interventions  National Suicide Hotline telephone number  [facility] assessment telephone number  [facility] Emergency Assistance [place] and/or Residential Mobile Crisis Unit telephone number  Request made of family/significant other to:  Remove weapons (e.g., guns, rifles, knives), all items previously/currently identified as safety concern.  Mother advised patient does not have access to weapons.    Remove drugs/medications (over-the-counter, prescriptions, illicit drugs), all items previously/currently identified as a safety concern.  The family member/significant other verbalizes understanding of the suicide prevention education information provided.  The family member/significant other agrees to remove the items of safety concern listed above.  [name] 09/22/2013, 2:28 PM

## 2013-09-22 NOTE — Progress Notes (Signed)
Adult Psychoeducational Group Note  Date:  09/22/2013 Time:  10:00am Group Topic/Focus:  Personal Choices and Values:   The focus of this group is to help patients assess and explore the importance of values in their lives, how their values affect their decisions, how they express their values and what opposes their expression.  Participation Level:  Active  Participation Quality:  Appropriate and Attentive  Affect:  Appropriate  Cognitive:  Alert and Appropriate  Insight: Appropriate  Engagement in Group:  Engaged  Modes of Intervention:  Discussion and Education  Additional Comments:  Pt attended and participated in group. Discussion was on personal development and the question was asked What does personal development mean to you? Pt stated it means looking at self and seeing where you can make changes to better yourself.  Marlowe Shores D 09/22/2013, 6:27 PM

## 2013-09-22 NOTE — Progress Notes (Signed)
Child/Adolescent Psychoeducational Group Note  Date:  09/22/2013 Time:  10:02 PM  Group Topic/Focus:  Wrap-Up Group:   The focus of this group is to help patients review their daily goal of treatment and discuss progress on daily workbooks.  Participation Level:  Active  Participation Quality:  Appropriate  Affect:  Appropriate  Cognitive:  Appropriate  Insight:  Appropriate  Engagement in Group:  Engaged and Supportive  Modes of Intervention:  Support  Additional Comments:  Pt stated that positive thing that happened today was that she was able to interact more with her peers and that she got to see one of her peers smile who havent done so since shes been here. She was very supportive to her peers and her goal; for tomorrow is to keep a positive attitude   Browning Southwood 09/22/2013, 10:02 PM
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
## 2013-09-23 NOTE — Clinical Social Work Note (Signed)
CSW spoke with Ellison Hughs, Human Resources at [facility], as requested by patient.  Ms. Celene Skeen to send Short term disability forms and/or FMLA forms to be completed.

## 2013-09-23 NOTE — Progress Notes (Signed)
D: Patient in the dayroom on approach.  Patient states she had a good day.  Patient states she know now that she has to work on herself and states she cannot take on problems of others.  Patient states she is excited because she is going to be discharged tomorrow and she will get to see her 40 year old daughter.  Patient denies SI/HI and denies AVH.Marland Kitchen A: Staff to monitor Q 15 mins for safety.  Encouragement and support offered.  Scheduled medications administered per orders. R: Patient remains safe on the unit.  Patient attended group tonight.  Patient visible on the unit and interacting with peers.  Patient taking administered medications.

## 2013-09-23 NOTE — BHH Group Notes (Signed)
090 nursing orientation group  The focus of this group is to educate the patient on the purpose and policies of crisis stabilization and provide a format to answer questions about their admission.  The group details unit policies and expectations of patients while admitted.   Pt was an active participant and was appropriate in sharing.

## 2013-09-23 NOTE — Progress Notes (Signed)
Patient ID: Bianca Strickland, female   DOB: 01-17-1976, 38 y.o.   MRN: [id number] [facility] MD Progress Note  09/23/2013 3:21 PM DELEAH TISON  MRN:  [id number] Subjective:She states she is feeling better, and is less depressed Objective:  Patient presents with improving mood and affect. She is feeling more hopeful, and more optimistic she is going to improve further. Has been going to groups consistently/ interactive with peers. Denies medication side effects. She is looking forward to returning home soon. Of note, she states that her mother has decided to come from [place] and stay with here for a week or two to help with her child and provide support. Patient  happy about this. Of note, pulse has been consistently elevated- patient has no associated symptoms. She states it may be anxiety at the time of BP/pulse monitoring. At this time her pulse is 64.  Diagnosis:  MDD   Total Time spent with patient: 25 minutes  ADL's:  improving  Sleep: Improved   Appetite:  Improved   Suicidal Ideation:  Denies any suicidal ideations Homicidal Ideation:  Denies any homicidal ideations AEB (as evidenced by):  Psychiatric Specialty Exam: Physical Exam  Review of Systems  Respiratory: Negative for cough and shortness of breath.   Cardiovascular: Negative for chest pain.  Gastrointestinal: Positive for nausea. Negative for heartburn, vomiting, abdominal pain and diarrhea.    Blood pressure 111/67, pulse 121, temperature 98.1 F (36.7 C), temperature source Oral, resp. rate 20, height 5' 8"  (1.727 m), weight 142.883 kg (315 lb), last menstrual period 08/30/2013.Body mass index is 47.91 kg/(m^2).  General Appearance: improved grooming  Eye Contact::  Good  Speech:  Normal Rate  Volume:  Normal  Mood:  improving  Affect: improving, more reactive affect  Thought Process:  Goal Directed and Linear  Orientation:  NA- fully alert and attentive  Thought Content:  no hallucinations, no delusions   Suicidal Thoughts:  No- at this time not suicidal or homicidal ideations  Homicidal Thoughts:  No  Memory:  NA  Judgement:  Good  Insight:  Fair  Psychomotor Activity:  Normal  Concentration:  Good  Recall:  Good  Fund of Knowledge:Good  Language: Good  Akathisia:  Negative  Handed:  Right  AIMS (if indicated):     Assets:  Communication Skills Desire for Improvement Resilience  Sleep:  Number of Hours: 6.5   Musculoskeletal: Strength & Muscle Tone: within normal limits Gait & Station: normal Patient leans: N/A  Current Medications: Current Facility-Administered Medications  Medication Dose Route Frequency Provider Last Rate Last Dose  . acetaminophen (TYLENOL) tablet 650 mg  650 mg Oral Q6H PRN Laverle Hobby, PA-C      . alum & mag hydroxide-simeth (MAALOX/MYLANTA) 200-200-20 MG/5ML suspension 30 mL  30 mL Oral Q4H PRN Laverle Hobby, PA-C      . drospirenone-ethinyl estradiol Sherrill Raring) 3-0.02 MG per tablet 1 tablet  1 tablet Oral Daily Neita Garnet, MD   1 tablet at 09/23/13 0800  . escitalopram (LEXAPRO) tablet 10 mg  10 mg Oral Daily Neita Garnet, MD   10 mg at 09/23/13 0756  . hydrOXYzine (ATARAX/VISTARIL) tablet 25 mg  25 mg Oral Q6H PRN Laverle Hobby, PA-C      . magnesium hydroxide (MILK OF MAGNESIA) suspension 30 mL  30 mL Oral Daily PRN Laverle Hobby, PA-C      . pantoprazole (PROTONIX) EC tablet 40 mg  40 mg Oral Daily Laverle Hobby, PA-C  40 mg at 09/23/13 0756  . traZODone (DESYREL) tablet 50 mg  50 mg Oral QHS,MR X 1 Laverle Hobby, PA-C   50 mg at 09/22/13 2246    Lab Results:  Results for orders placed during the hospital encounter of 09/20/13 (from the past 48 hour(s))  RPR     Status: None   Collection Time    09/22/13  6:30 AM      Result Value Ref Range   RPR NON REAC  NON REAC   Comment: Performed at [facility]     Status: Abnormal   Collection Time    09/22/13  6:30 AM      Result Value Ref Range    Vitamin B-12 980 (*) 211 - 911 pg/mL   Comment: Performed at [facility]  TSH     Status: None   Collection Time    09/22/13  6:30 AM      Result Value Ref Range   TSH 3.490  0.350 - 4.500 uIU/mL   Comment: Performed at [facility]  HIV ANTIBODY (ROUTINE TESTING)     Status: None   Collection Time    09/22/13  6:30 AM      Result Value Ref Range   HIV 1&2 Ab, 4th Generation NONREACTIVE  NONREACTIVE   Comment: (NOTE)     A NONREACTIVE HIV Ag/Ab result does not exclude HIV infection since     the time frame for seroconversion is variable. If acute HIV infection     is suspected, a HIV-1 RNA Qualitative TMA test is recommended.     HIV-1/2 Antibody Diff         Not indicated.     HIV-1 RNA, Qual TMA           Not indicated.     PLEASE NOTE: This information has been disclosed to you from records     whose confidentiality may be protected by state law. If your state     requires such protection, then the state law prohibits you from making     any further disclosure of the information without the specific written     consent of the person to whom it pertains, or as otherwise permitted     by law. A general authorization for the release of medical or other     information is NOT sufficient for this purpose.     The performance of this assay has not been clinically validated in     patients less than 48 years old.     Performed at [facility]    Physical Findings: AIMS: Facial and Oral Movements Muscles of Facial Expression: None, normal Lips and Perioral Area: None, normal Jaw: None, normal Tongue: None, normal,Extremity Movements Upper (arms, wrists, hands, fingers): None, normal Lower (legs, knees, ankles, toes): None, normal, Trunk Movements Neck, shoulders, hips: None, normal, Overall Severity Severity of abnormal movements (highest score from questions above): None, normal Incapacitation due to abnormal movements: None, normal Patient's awareness of  abnormal movements (rate only patient's report): No Awareness, Dental Status Current problems with teeth and/or dentures?: No Does patient usually wear dentures?: No  CIWA:  CIWA-Ar Total: 0 COWS:     Assessment: She continues to improve gradually, but her mood and affect today are clearly better than upon her admission and she seems more optimistic and brighter overall. She is tolerating Lexapro well Her daughter ( currently in [place] with patient's stepmother) will  be returning to [employer], but patient's mother will also be coming in to help for a period of time.  Treatment Plan Summary: Daily contact with patient to assess and evaluate symptoms and progress in treatment Medication management See below  Plan: Continue inpatient treatment/support. Continue Lexapro 10 mgrs QDAY. Patient is interested in IOP as a potential disposition option if possible.  Medical Decision Making Problem Points:  Established problem, stable/improving (1) and Review of psycho-social stressors (1) Data Points:  Review of medication regiment & side effects (2)  I certify that inpatient services furnished can reasonably be expected to improve the patient's condition.   COBOS, [place] 09/23/2013, 3:21 PM

## 2013-09-23 NOTE — Progress Notes (Signed)
Pt attended group

## 2013-09-24 MED ORDER — ESCITALOPRAM OXALATE 10 MG PO TABS: 10.0000 mg | ORAL_TABLET | Freq: Every day | ORAL | Status: AC

## 2013-09-24 MED ORDER — FERROUS FUMARATE 325 (106 FE) MG PO TABS: 1.0000 | ORAL_TABLET | Freq: Two times a day (BID) | ORAL | Status: AC

## 2013-09-24 MED ORDER — DROSPIRENONE-ETHINYL ESTRADIOL 3-0.02 MG PO TABS: 1.0000 | ORAL_TABLET | Freq: Every day | ORAL | Status: AC

## 2013-09-24 MED ORDER — TRAZODONE HCL 50 MG PO TABS: 50.0000 mg | ORAL_TABLET | Freq: Every evening | ORAL | Status: AC | PRN

## 2013-09-24 MED ORDER — OMEPRAZOLE MAGNESIUM 20 MG PO TBEC: 20.0000 mg | DELAYED_RELEASE_TABLET | Freq: Every day | ORAL | Status: AC

## 2013-09-24 MED ORDER — HYDROXYZINE HCL 25 MG PO TABS: ORAL_TABLET | ORAL | Status: AC

## 2013-09-24 MED ORDER — HYDROXYZINE HCL 25 MG PO TABS
25.0000 mg | ORAL_TABLET | Freq: Three times a day (TID) | ORAL | Status: DC | PRN
  Filled 2013-09-24: qty 6

## 2013-09-24 NOTE — BHH Group Notes (Signed)
[facility] LCSW Aftercare Discharge Planning Group Note   09/24/2013 10:41 AM    Participation Quality:  [place]ropraite  Mood/Affect:  Appropriate  Depression Rating:  0  Anxiety Rating:  0  Thoughts of Suicide:  No  Will you contract for safety?   NA  Current AVH:  No  Plan for Discharge/Comments:  Patient attended discharge planning group and actively participated in group.  She will return to her home and follow up with [facility].  CSW provided all participants with daily workbook.   Transportation Means: Patient has transportation.   Supports:  Patient has a support system.   Joanmarie Tsang, [place]

## 2013-09-24 NOTE — Tx Team (Addendum)
Interdisciplinary Treatment Plan Update   Date Reviewed:  09/24/2013  Time Reviewed:  8:37 AM  Progress in Treatment:   Attending groups: Yes Participating in groups: Yes Taking medication as prescribed: Yes  Tolerating medication: Yes Family/Significant other contact made:  Yes, collateral contact with mother Patient understands diagnosis: Yes  Discussing patient identified problems/goals with staff: Yes Medical problems stabilized or resolved: Yes Denies suicidal/homicidal ideation: Yes Patient has not harmed self or others: Yes  For review of initial/current patient goals, please see plan of care.  Estimated Length of Stay:  Discharge today  Reasons for Continued Hospitalization:    New Problems/Goals identified:    Discharge Plan or Barriers:   Home with outpatient follow up with [facility]  Additional Comments:    Attendees:  Patient:  09/24/2013 8:37 AM   Signature:  Gabriel Earing, MD 09/24/2013 8:37 AM  Signature:  09/24/2013 8:37 AM  Signature:   09/24/2013 8:37 AM  Signature:  Talbert Cage RN 09/24/2013 8:37 AM  Signature:  Luz Brazen,  RN 09/24/2013 8:37 AM  Signature:  Joette Catching, LCSW 09/24/2013 8:37 AM  Signature:   Kristen Drinkard, LCSW-A 09/24/2013 8:37 AM  Signature:   09/24/2013 8:37 AM  Signature:  09/24/2013 8:37 AM  Signature: 09/24/2013  8:37 AM  Signature:   Lars Pinks, RN [facility] 09/24/2013  8:37 AM  Signature: 09/24/2013  8:37 AM    Scribe for Treatment Team:   Joette Catching,  09/24/2013 8:37 AM
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
## 2013-09-24 NOTE — Progress Notes (Signed)
[facility] Adult Case Management Discharge Plan :  Will you be returning to the same living situation after discharge: Yes,  Patient is returning to he home. At discharge, do you have transportation home?:Yes,  Patient to arraange transportation home. Do you have the ability to pay for your medications:Yes,  Patient is able to obtain medications.  Release of information consent forms completed and in the chart;  Patient's signature needed at discharge.  Patient to Follow up at: Follow-up Information   Follow up with [facility]  On 10/04/2013. (Monday, October 04, 2012 at 4:00 PM)    Contact information:   [address], [place]   [address]  [phone]      Follow up with Dr. Silvio Pate On 10/05/2013. (Tuesday, October 05, 2012 at 10 AM)    Contact information:   [address], [place]  [phone]      Patient denies SI/HI:   Patient no longer endorsing SI/HI or other thoughts of self harm.   Safety Planning and Suicide Prevention discussed: .Reviewed with all patients during discharge planning group   Bianca Strickland, Eulas Post 09/24/2013, 12:17 PM

## 2013-09-24 NOTE — Progress Notes (Signed)
Nrsg DC MD completed DC order and DC SRA in chart. Pt completed her morning  assessment and she denies SI within the past 24 hrs and says her DC plan is to go to the dr. DC AVS reviewed with pt and pt verbalizes understanding, she is given prescriptions and sample meds and then all belongings previously in locker are returned to her and then she was escorted to bldg entrance and [place] home.

## 2013-09-27 LAB — VITAMIN D 1,25 DIHYDROXY
Vitamin D 1, 25 (OH)2 Total: 122 pg/mL — ABNORMAL HIGH (ref 18–72)
Vitamin D2 1, 25 (OH)2: 73 pg/mL
Vitamin D3 1, 25 (OH)2: 49 pg/mL

## 2013-09-28 NOTE — Progress Notes (Signed)
Patient Discharge Instructions:  After Visit Summary (AVS):   Faxed to:  09/28/13 Discharge Summary Note:   Faxed to:  09/28/13 Psychiatric Admission Assessment Note:   Faxed to:  09/28/13 Suicide Risk Assessment - Discharge Assessment:   Faxed to:  09/28/13 Faxed/Sent to the Next Level Care provider:  09/28/13 Faxed to Evangeline @ [facility], 09/28/2013, 3:14 PM

## 2013-12-20 ENCOUNTER — Encounter (HOSPITAL_COMMUNITY): Payer: Self-pay | Admitting: Emergency Medicine

## 2014-09-28 ENCOUNTER — Encounter: Attending: Women's Health

## 2014-10-07 ENCOUNTER — Ambulatory Visit: Admit: 2014-10-07 | Discharge: 2014-10-07 | Payer: PRIVATE HEALTH INSURANCE | Attending: Women's Health

## 2014-10-07 DIAGNOSIS — Z01419 Encounter for gynecological examination (general) (routine) without abnormal findings: Secondary | ICD-10-CM

## 2014-10-07 MED ORDER — DROSPIRENONE-ETHINYL ESTRADIOL 3-0.02 MG PO TABS
ORAL_TABLET | Freq: Every day | ORAL | 3 refills | Status: DC
Start: 2014-10-07 — End: 2015-01-06

## 2014-10-07 NOTE — Patient Instructions (Signed)
Learning About Birth Control: Combination Pills  What are combination pills?     Combination pills are used to prevent pregnancy. Most people call them "the pill."  Combination pills release a regular dose of two hormones, estrogen and progestin. They prevent pregnancy in a few ways. They thicken the mucus in the cervix. This makes it hard for sperm to travel into the uterus. And they thin the lining of the uterus. This makes it harder for a fertilized egg to attach to the uterus.  The hormones also can stop the ovaries from releasing an egg each month (ovulation).  You have to take a pill every day to prevent pregnancy.  The packages for these pills are different. The most common one has 3 weeks of hormone pills and 1 week of sugar pills. The sugar pills don't contain any hormones. You have your period on that week. But other packs have no sugar pills. If you take hormone pills for the whole month, you will not get your period as often. Or you may not get it at all.  How well do they work?  In the first year of use:   When combination pills are taken exactly as directed, fewer than 1 woman out of 100 has an unplanned pregnancy.   When pills are not taken exactly as directed, such as forgetting to take them sometimes, 9 women out of 100 have an unplanned pregnancy.  Be sure to tell your doctor about any health problems you have or medicines you take. He or she can help you choose the birth control method that is right for you.  What are the advantages of combination pills?   These pills work better than barrier methods. Barrier methods include condoms and diaphragms.   They may reduce acne and heavy bleeding. They may also reduce cramping and other symptoms of PMS (premenstrual syndrome).   The pills let you control your periods. You can have periods every month or every few months. Or you can choose not to have them at all.   You don't have to interrupt sex to use the pills.  What are the disadvantages of  combination pills?   You have to take a pill at the same time every day to prevent pregnancy.   Combination pills don't protect against sexually transmitted infections (STIs), such as herpes or HIV/AIDS. If you aren't sure if your sex partner might have an STI, use a condom to protect against disease.   They may cause changes in your period. You may have little bleeding, skipped periods, or spotting.   They may cause mood changes, less interest in sex, or weight gain.   Combination pills contain estrogen. They may not be right for you if you have certain health problems.   Where can you learn more?   Go to [email or web address] and sign in to your MyChart account. Enter [phone] in the [place] box to learn more about "Learning About Birth Control: Combination Pills."    If you do not have an account, please click on the "Sign Up Now" link.    2006-2016 Healthwise, Incorporated. Care instructions adapted under license by [facility]. This care instruction is for use with your licensed healthcare professional. If you have questions about a medical condition or this instruction, always ask your healthcare professional. [place] any warranty or liability for your use of this information.  Content Version: 10.8.513193; Current as of: Jul 09, 2013  Combination Birth Control Pills: Care Instructions  Your Care Instructions     Combination birth control pills are used to prevent pregnancy. They give you a regular dose of the hormones estrogen and progestin.  You take a hormone pill every day to prevent pregnancy.  Birth control pills come in packs. The most common type has 3 weeks of hormone pills. Some packs have sugar pills (they do not contain any hormones) for the fourth week. During that fourth no-hormone week, you have your period. After the fourth week (28 days), you start a new pack.  Some birth control pills are packaged in different ways. For  example, some have hormone pills for the fourth week instead of sugar pills. Taking hormones for the entire month causes you to not have periods or to have fewer periods. Others are packaged so that you have a period every 3 months. Your doctor will tell you what type of pills you have.  Follow-up care is a key part of your treatment and safety. Be sure to make and go to all appointments, and call your doctor if you are having problems. It's also a good idea to know your test results and keep a list of the medicines you take.  How can you care for yourself at home?  How do you take the pill?    Follow your doctor's instructions about when to start taking your pills. Use backup birth control, such as a condom, or don't have intercourse for 7 days after you start your pills.   Take your pills every day, at about the same time of day. To help yourself do this, try to take them when you do something else every day, such as brushing your teeth.  What if you forget to take a pill?   Always read the label for specific instructions, or call your doctor. Here are some basic guidelines:   If you miss 1 hormone pill, take it as soon as you remember. Ask your doctor if you may need to use a backup birth control method, such as a condom, or not have intercourse.   If you miss 2 or more hormone pills, take one as soon as you remember you forgot them. Then read the pill label or call your doctor about instructions on how to take your missed pills. Use a backup method of birth control or don't have intercourse for 7 days. Pregnancy is more likely if you miss more than 1 pill.   If you had intercourse, you can use emergency contraception, such as the morning-after pill (Plan B). You can use emergency contraception for up to 5 days after having had intercourse, but it works best if you take it right away.  What else do you need to know?    The pill has side effects.   You may have very light or skipped periods.   You may have  bleeding between periods (spotting). This usually decreases after 3 to 4 months.   You may have mood changes, less interest in sex, or weight gain.   The pill may reduce acne, heavy bleeding and cramping, and symptoms of premenstrual syndrome.   Check with your doctor before you use any other medicines, including over-the-counter medicines, vitamins, herbal products, and supplements. Birth control hormones may not work as well to prevent pregnancy when combined with other medicines.   The pill doesn't protect against sexually transmitted infection (STIs), such as herpes or HIV/AIDS. If you're not sure whether your sex partner  might have an STI, use a condom to protect against disease.  When should you call for help?  Call your doctor now or seek immediate medical care if:   You have severe belly pain.   You have signs of a blood clot, such as:   Pain in your calf, back of the knee, thigh, or groin.   Redness and swelling in your leg or groin.   You have blurred vision or other problems seeing.   You have a severe headache.   You have severe trouble breathing.  Watch closely for changes in your health, and be sure to contact your doctor if:   You think you might be pregnant.   You think you may be depressed.   You think you may have been exposed to or have a sexually transmitted infection.   Where can you learn more?   Go to [email or web address] and sign in to your MyChart account. Enter [phone] in the [place] box to learn more about "Combination Birth Control Pills: Care Instructions."    If you do not have an account, please click on the "Sign Up Now" link.    2006-2016 Healthwise, Incorporated. Care instructions adapted under license by [facility]. This care instruction is for use with your licensed healthcare professional. If you have questions about a medical condition or this instruction, always ask your healthcare professional. [place] any warranty or liability for your use of this information.  Content Version: 10.8.513193; Current as of: Jul 09, 2013

## 2014-10-07 NOTE — Progress Notes (Signed)
Chief Complaint   Patient presents with   . Gynecologic Exam     pt also thinks she has a UTI       Patient's last menstrual period was 09/26/2014.     History:    Past Medical History   Diagnosis Date   . Depression        Past Surgical History   Procedure Laterality Date   . Gastric bypass surgery  2009       No family history on file.    Social History     Social History   . Marital status: Single     Spouse name: N/A   . Number of children: N/A   . Years of education: N/A     Social History Main Topics   . Smoking status: Never Smoker   . Smokeless tobacco: None   . Alcohol use Yes      Comment: occasionally   . Drug use: No   . Sexual activity: Not Asked     Other Topics Concern   . None     Social History Narrative   . None       Allergies:    No Known Allergies    Medications:    No current outpatient prescriptions on file prior to visit.     No current facility-administered medications on file prior to visit.        HPI:    HPI Comments: G2P1 here for APE.  Denies troubles with vaginal bleeding.  Denies troubles with breasts.  Denies pelvic pain and vaginal discharge.  Reports she has been experiencing urinary hesitancy and urgency.  Denies frequency and burning.  Reports hx abnormal pap "years" ago.  Paps normal since.  Sexually active with one partner.  Desires to get back on OCP's for contraception.  Reports she was on Yaz until she lost her insurance in January.      Gynecologic Exam         ROS:    Review of Systems   Constitutional: Negative.    Genitourinary: Positive for difficulty urinating and urgency. Negative for vaginal bleeding, vaginal discharge and vaginal pain.       Physical exam:    Visit Vitals   . BP 111/77 (Site: Left Arm, Position: Sitting, Cuff Size: Large Adult)   . Pulse 69   . Wt (!) 325 lb 9.6 oz (147.7 kg)   . LMP 09/26/2014       Physical Exam   Constitutional: She is oriented to person, place, and time. She appears well-developed and well-nourished.   HENT:   Head:  Normocephalic and atraumatic.   Neck: Normal range of motion.   Pulmonary/Chest: Right breast exhibits no inverted nipple, no mass, no nipple discharge, no skin change and no tenderness. Left breast exhibits no inverted nipple, no mass, no nipple discharge, no skin change and no tenderness. Breasts are symmetrical.   Abdominal: Soft.   Genitourinary: No labial fusion. There is no rash, tenderness, lesion or injury on the right labia. There is no rash, tenderness, lesion or injury on the left labia. Uterus is not deviated, not enlarged, not fixed and not tender. Cervix exhibits no motion tenderness, no discharge and no friability. Right adnexum displays no mass, no tenderness and no fullness. Left adnexum displays no mass, no tenderness and no fullness. No erythema, tenderness or bleeding in the vagina. No foreign body in the vagina. No signs of injury around the vagina. No vaginal discharge found.  Genitourinary Comments: Exam limited by habitus.   Musculoskeletal: Normal range of motion.   Neurological: She is alert and oriented to person, place, and time.   Skin: Skin is warm.   Psychiatric: She has a normal mood and affect.       Assessment and Plan:    Kaitlin Kelly was seen today for gynecologic exam.    Diagnoses and all orders for this visit:    Visit for gynecologic examination  Orders:  -     PAP SMEAR    STD exposure  Orders:  -     C. Trachomatis / N. Gonorrhoeae, DNA Probe    Encounter for screening mammogram for breast cancer  Orders:  -     MAM Digital Screen Bilateral    Contraception management  Orders:  -     drospirenone-ethinyl estradiol (YAZ) 3-0.02 MG per tablet; Take 1 tablet by mouth daily    Dysuria  Orders:  -     Urine Culture    Discussed increased r/o blood clot with Yaz.  Pt verbalizes understanding.  Denies smoking.  Also, discussed concern for gastric bypass and OCP's in relationship to decreased absorption.  Pt verbalizes understanding.  Pt does note that she is done having children.   Discussed LARC and BTL.  Pt declines.  Wishes to restart her Yaz.      Return in about 3 months (around 01/07/2015) for BP check.

## 2014-10-08 LAB — C. TRACHOMATIS / N. GONORRHOEAE, DNA
C. Trachomatis Amplified: NEGATIVE NA
N. Gonorrhoeae Amplified: NEGATIVE NA

## 2014-10-09 LAB — CULTURE, URINE: Urine Culture, Routine: >100000

## 2014-10-10 MED ORDER — CIPROFLOXACIN HCL 250 MG PO TABS
250 MG | ORAL_TABLET | Freq: Two times a day (BID) | ORAL | 0 refills | Status: AC
Start: 2014-10-10 — End: 2014-10-15

## 2014-10-10 NOTE — Telephone Encounter (Signed)
Please let pt know she has UTI.  Rx Cipro sent to pharmacy.  Thanks.

## 2014-10-10 NOTE — Telephone Encounter (Signed)
Notified of K. Bird, NP's comments.  Verbalized understanding.

## 2014-10-11 ENCOUNTER — Ambulatory Visit: Admit: 2014-10-11 | Discharge: 2014-10-11 | Payer: PRIVATE HEALTH INSURANCE | Attending: Family Medicine

## 2014-10-11 DIAGNOSIS — G4733 Obstructive sleep apnea (adult) (pediatric): Secondary | ICD-10-CM

## 2014-10-11 LAB — PAP SMEAR

## 2014-10-11 MED ORDER — BUPROPION HCL ER (XL) 300 MG PO TB24
300 MG | ORAL_TABLET | Freq: Every morning | ORAL | 1 refills | Status: DC
Start: 2014-10-11 — End: 2015-06-21

## 2014-10-11 NOTE — Progress Notes (Signed)
Subjective:     Patient: Kaitlin Kelly is a 39 y.o. female     HPI   Patient is here to establish care.  She moved back to the [place] area this past November from [place].  She is originally from [place].  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  She has a long history of depression since she was a teenager.  She had a suicide attempt at that time.  She thinks that she never got the proper assistance to the get better.  One year ago, she was admitted to a psychiatric hospital for a week after a suicide attempt by taking a lot of pills.  She was discharged on Lexapro, Wellbutrin, and Abilify.  She states that when she first moved back, she had Medicaid and was seeing a psychiatrist at an agency in [place].  She was weaned down to just taking Wellbutrin.  She feels that she is doing well now.  She has an appointment set up for a new counselor at [facility].  She is currently on Cipro for a bladder infection diagnosed by her gynecologist.  She has a history of sleep apnea that resolved after she had gastric bypass surgery and lost weight in 2009.  She has gained some of her weight back and feels that she is having trouble sleeping again and has been very tired.  She would like to get another sleep study to see if she needs CPAP.  She states she had an EGD about a year ago done by a bariatric surgeon to see if her pouch was stretched out.  She was interested in getting a revision surgery.  She states that they saw inflammation in her esophagus and she has been on omeprazole since then.  She denies dysphagia.    Review of Systems   Constitutional: Positive for fatigue. Negative for unexpected weight change.   HENT: Negative for congestion, postnasal drip, sore throat and trouble swallowing.    Eyes: Negative for pain and visual disturbance.   Respiratory: Negative for cough and shortness of breath.    Cardiovascular: Negative for chest pain, palpitations and leg swelling.   Gastrointestinal: Negative for  blood in stool, constipation and diarrhea.   Genitourinary: Negative for dysuria and menstrual problem.   Neurological: Negative for weakness, light-headedness and headaches.   Psychiatric/Behavioral: Positive for sleep disturbance. Negative for dysphoric mood. The patient is not nervous/anxious.         No Known Allergies  Outpatient Medications Prior to Visit   Medication Sig Dispense Refill   . ciprofloxacin (CIPRO) 250 MG tablet Take 1 tablet by mouth 2 times daily for 5 days 10 tablet 0   . omeprazole (PRILOSEC) 20 MG capsule Take 20 mg by mouth daily     . drospirenone-ethinyl estradiol (YAZ) 3-0.02 MG per tablet Take 1 tablet by mouth daily 28 tablet 3   . buPROPion (WELLBUTRIN XL) 300 MG XL tablet Take 300 mg by mouth every morning       No facility-administered medications prior to visit.       Past Medical History   Diagnosis Date   . Contraception management    . Depression       Past Surgical History   Procedure Laterality Date   . Gastric bypass surgery  2009   . Upper gastrointestinal endoscopy  05/2013     [facility]     Family History   Problem Relation Age of Onset   . High Blood Pressure  Mother    . Kidney Disease Father    . High Blood Pressure Father    . Diabetes Father    . No Known Problems Brother    . Cancer Paternal Grandfather      unknown     Social History   Substance Use Topics   . Smoking status: Never Smoker   . Smokeless tobacco: Not on file   . Alcohol use Yes      Comment: occasionally          Objective:     Visit Vitals   . BP 114/81 (Site: Left Arm, Position: Sitting, Cuff Size: Thigh)   . Pulse 61   . Temp 98.4 F (36.9 C) (Tympanic)   . Ht 5' 8"  (1.727 m)   . Wt (!) 324 lb (147 kg)   . LMP 09/26/2014   . BMI 49.26 kg/m2       Physical Exam   Constitutional: She is oriented to person, place, and time. She appears well-developed and well-nourished. No distress.   HENT:   Head: Normocephalic and atraumatic.   Right Ear: External ear normal.   Left Ear: External ear normal.    Nose: Nose normal.   Mouth/Throat: Oropharynx is clear and moist.   Eyes: Conjunctivae and EOM are normal. Pupils are equal, round, and reactive to light. Right eye exhibits no discharge. Left eye exhibits no discharge. No scleral icterus.   Neck: Normal range of motion. Neck supple. No tracheal deviation present.   Cardiovascular: Normal rate, regular rhythm, normal heart sounds and intact distal pulses.  Exam reveals no gallop and no friction rub.    No murmur heard.  Pulmonary/Chest: Effort normal and breath sounds normal. No respiratory distress. She has no wheezes. She has no rales. She exhibits no tenderness.   Abdominal: Soft. Bowel sounds are normal. She exhibits no distension and no mass. There is no tenderness. There is no rebound and no guarding.   Musculoskeletal: Normal range of motion. She exhibits edema. She exhibits no tenderness.   Mild pitting edema bilateral lower extremities     Lymphadenopathy:     She has no cervical adenopathy.   Neurological: She is alert and oriented to person, place, and time. No cranial nerve deficit. Coordination normal.   Skin: Skin is warm and dry. No rash noted. She is not diaphoretic. No erythema.   Psychiatric: She has a normal mood and affect. Her behavior is normal. Thought content normal.       Assessment      1. OSA (obstructive sleep apnea)    2. Recurrent major depressive disorder, in partial remission ([place])    3. Morbid obesity due to excess calories ([place])    4. History of Roux-en-Y gastric bypass    5. Gastroesophageal reflux disease with esophagitis    6. Screening for HIV (human immunodeficiency virus)         Plan      1. OSA (obstructive sleep apnea)  - Baseline Diagnostic Sleep Study; Future    2. Recurrent major depressive disorder, in partial remission ([place])    3. Morbid obesity due to excess calories ([facility])  - TSH without Reflex  - Lipid Panel    4. History of Roux-en-Y gastric bypass  - Comprehensive Metabolic Panel  - CBC  - Vitamin B12  - Vitamin  D 25 Hydroxy  - Iron and TIBC  - Ferritin  - [facility] - Iline Oven, MD  5. Gastroesophageal reflux disease with esophagitis  - [facility] Adv [facility] - Iline Oven, MD    6. Screening for HIV (human immunodeficiency virus)  - HIV Screen    A refill was sent for Wellbutrin.  Labs were drawn today.    [name], DO  10/11/14  4:33 PM

## 2014-10-12 LAB — IRON AND TIBC
Iron: 19 ug/dL — ABNORMAL LOW (ref 50–170)
Sat: 3 % — ABNORMAL LOW (ref 15–50)
TIBC: 559 ug/dL — ABNORMAL HIGH (ref 250–450)

## 2014-10-12 LAB — COMPREHENSIVE METABOLIC PANEL
ALT: 21 U/L (ref 12–78)
AST: 16 U/L (ref 15–37)
Albumin,Serum: 3.7 g/dL (ref 3.4–5.0)
Alkaline Phosphatase: 73 U/L (ref 45–117)
Anion Gap: 8 NA
BUN: 14 mg/dL (ref 7–25)
CO2: 26 mmol/L (ref 21–32)
Calcium: 9.1 mg/dL (ref 8.2–10.1)
Chloride: 107 mmol/L (ref 98–109)
Creatinine: 1.12 mg/dL (ref 0.55–1.40)
EGFR IF NonAfrican American: 53.7 mL/min (ref >60)
Glucose: 69 mg/dL — ABNORMAL LOW (ref 70–100)
Potassium: 4.3 mmol/L (ref 3.5–5.1)
Sodium: 141 mmol/L (ref 135–145)
Total Bilirubin: 0.4 mg/dL (ref 0.2–1.0)
Total Protein: 7.3 g/dL (ref 6.4–8.2)
eGFR African American: >60 mL/min (ref >60)

## 2014-10-12 LAB — LIPID PANEL
Chol/HDL Ratio: 2 NA
Cholesterol: 180 mg/dL (ref <200)
HDL: 77 mg/dL — ABNORMAL HIGH (ref 40–59)
LDL Cholesterol: 84 mg/dL (ref <100)
Triglycerides: 93 mg/dL (ref <150)

## 2014-10-12 LAB — CBC
Hematocrit: 31.9 % — ABNORMAL LOW (ref 35.0–47.0)
Hemoglobin: 9.8 g/dL — ABNORMAL LOW (ref 11.7–16.0)
MCH: 22.6 pg — ABNORMAL LOW (ref 26.0–34.0)
MCHC: 30.8 % — ABNORMAL LOW (ref 32.0–36.0)
MCV: 73.5 fL — ABNORMAL LOW (ref 79.0–98.0)
MPV: 8.3 fL (ref 7.4–10.4)
Platelets: 285 10*3/uL (ref 140–440)
RBC: 4.34 10*6/uL (ref 3.80–5.20)
RDW: 17.4 % — ABNORMAL HIGH (ref 11.5–14.5)
WBC: 5.4 10*3/uL (ref 3.6–10.7)

## 2014-10-12 LAB — VITAMIN B12: Vitamin B-12: 225 pg/mL (ref 193–986)

## 2014-10-12 LAB — VITAMIN D 25 HYDROXY: Vit D, 25-Hydroxy: 11 ng/mL — ABNORMAL LOW (ref 30–100)

## 2014-10-12 LAB — FERRITIN: Ferritin: 6 ng/mL — ABNORMAL LOW (ref 8–252)

## 2014-10-12 LAB — HIV SCREEN: HIV 1+2 AB+HIV1P24 AG, EIA: NONREACTIVE NA

## 2014-10-12 LAB — TSH: TSH: 2.21 uU/mL (ref 0.358–3.740)

## 2014-10-13 LAB — HPV, HIGH RISK
HPV, Genotype 16: NOT DETECTED NA
HPV, Genotype 18: DETECTED NA — AB
Other HR HPV Genotypes: DETECTED NA — AB

## 2014-10-13 MED ORDER — FERROUS SULFATE 325 (65 FE) MG PO TABS
325 (65 Fe) MG | ORAL_TABLET | Freq: Two times a day (BID) | ORAL | 3 refills | Status: AC
Start: 2014-10-13 — End: ?

## 2014-10-13 MED ORDER — CHOLECALCIFEROL 1.25 MG (50000 UT) PO CAPS
50000 UNIT | ORAL_CAPSULE | ORAL | 0 refills | Status: AC
Start: 2014-10-13 — End: ?

## 2014-10-13 NOTE — Telephone Encounter (Signed)
Pt notified, understood and agreed.

## 2014-10-13 NOTE — Telephone Encounter (Signed)
Please let her know that her labs look fine except for very low vitamin D and low iron.  Her low iron is causing her to be anemic.  I sent in a prescription for an iron supplement to take twice daily.  This is usually needed long-term in gastric bypass patients.  I also sent in a prescription for weekly high-dose vitamin D to take for 12 weeks.  After that, she should take an over-the-counter daily supplement of 2000 units daily.

## 2014-10-14 NOTE — Telephone Encounter (Signed)
-----   Message from Lawana Pai, NP sent at 10/13/2014  3:54 PM EDT -----  Please let pt know she has abnormal pap.  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 Consequently, pt will need to be seen for colp.  Please notify pt and schedule accordingly.  Thank you.

## 2014-10-17 NOTE — Telephone Encounter (Signed)
Notified of K. Bird, NP's comments. Scheduled for colp 10/28/14 @ 3p.  Verbalized understanding.

## 2014-10-17 NOTE — Telephone Encounter (Signed)
Who is calling:pt    Reason for your call: pt need tp reschedule Sept 29th appt    Best Time to return your call: any     Best number that you can be reached:[phone]    Patient instructed to call back with any further questions or concerns or no response within 24-48 hours.     Did this call require a physician to be paged? No    If Yes who did you page?

## 2014-10-17 NOTE — Telephone Encounter (Signed)
Called pt and rescheduled colp for 11/11/14 at 3:30PM

## 2014-10-20 ENCOUNTER — Ambulatory Visit: Admit: 2014-10-20 | Discharge: 2014-10-20 | Payer: PRIVATE HEALTH INSURANCE | Attending: Psychiatric/Mental Health

## 2014-10-20 DIAGNOSIS — F339 Major depressive disorder, recurrent, unspecified: Secondary | ICD-10-CM

## 2014-10-20 NOTE — Progress Notes (Signed)
Time in: 1023  Time out: Kaitlin Kelly  01-Jul-1975      Subjective:        Chief Complaint   Patient presents with   . Depression   . Medication Check   . Other         Patient comes to establish care for mental health. She reports that last year she was in [place] and decided to have herself admitted - the night prior to that had taken a bunch of pills and alcohol. She had hoped that she would go to sleep and not wake up. She reports that there was a build up of things that led to this. She has previously been on zoloft at the end of her pregnancy and after birth of her daughter and then she stopped taking it - she spiraled downhill. She was in the hospital and she was put back on zoloft. She then was put on lexapro due to uncontrollable crying and anxiety. Abilify was then added. She eventually went to the Centers in [place] in [place] and was put on Wellbutrin and taken off of her other medications - however the Abilify she had discontinued on her own prior to that follow up . When she was married she was taking lexapro and wellbutrin (2003); in 2009 she stopped taking medication. Prior to 2003 - no medication trials. Patient reports a historical dx of depression. One psychiatrist diagnosed her with bipolar disorder - she does not know why except she had gone on a vacation with friends and she reported that they did not sleep much. She thinks she has a family hx of mental health issues. She does feel her sx are well managed with Wellbutrin however she also somewhat feels gloomy. She perceives finances really effect her emotionally because she cannot do the things she would like to do. Other stressors - prior to moving back home she was a single and lonely parent but such has gotten better since moving back home. Her weight is an issue. She had gotten down to 220lb after gastric bypass. Weight gain started back after the birth of her daughter. She does have an appt with the weight loss center.  Mood  is generally at this point - "at a lull". Her menstrual cycle - gets really emotional prior to menstruation and usually such levels out after. She has been put on birth control 2 wks ago. She was on YAZ previously and "pms" was not as bad. Lingering sx of depression - "not feeling real motivated".  She has not started her vit D supplement. She denies lack of interest and anhedonia. No SI / HI. She does not feel happy but not sad - "I feel like I'm here existing" - she does have happy moments however.    Episodes of depression included sx such as:  Uncontrollable crying; feeling hopeless / helpless.   Patient during episodes of depression has had SI (last time February 2016) and also sucide attempts. She during episodes also may have anhedonia and lack of interest and agitation as well as irritability and sadness.  She cried every day during pregnancy    Patient used to have excessive worry - used to not be able to go anywhere by herself. This was a longstanding issue.                 Past Psychiatric History::   General Current medications for mental health - Wellbutrin    Medication  trialed for mental health in the past - lexapro, abilify, zoloft  Previous hospital admissions for [facility] admission in [place] was the only mental health stay - 2015  Previous mental health care / providers - PCP in the past has been prescriber (2003) as well as she has had other mental health providers. She had been engaged in therapy and last had a therapist appt in March 2016 approximately. She has been in therapy in [place] .  Previous suicide attempts - x1 in 2015 and x1 in HS (cut herself)  Previous depressive episodes - Depressive episodes since HS  Previous issues with cutting behaviors / eating disorders - no hx of cutting except for above  Previous history of mania / hypomania - denies hx of such features  Previous history of PTSD features - none considered except something may have happened to her that she does not  recall  Current / Previous history of checking behaviors, rituals, obsessions, compulsion - she does wash her hands excessively / does not like touching things that a lot of people touch / does suck her thumb  Never any psychosis  Substance Use History:   General Previous substance use treatment - none  Current substance use:   Alcohol - before her hospital stay for mental health (was drinking every day); may drink 1-2x per week a glass of wine.    Caffeine - 5x a week; not excessive consumption  Nicotine - never a smoker  Other - none  Previous substance use - college (marijuana socially)  Legal History: none  Developmental/Social History:   Abuse/trauma .... Uncertain / some memory issues  Education .... 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 none  Marital .... Married x1 / divorced x1  Family/Social Relations .... Patient grew up in [place] and currently lives in [place]. She had lived in [place] after college and then moved to [facility] for a job in 2011. She has been back in [place] since November 2015. She lives with her daughter (3 yrs)    Religion/Spiritual Beliefs .... Spiritual / Christian          ROS:       Patient's hx was reviewed. She has c/o with current weight. The patient has been prescribed vit D and iron due to deficiencies however has not started those (meidcal contributing to low energy ?). Patient reports hx of sleep apnea                     Past Medical History   Diagnosis Date   . Anemia    . Contraception management    . Depression    . Headache    . Sleep apnea        Past Surgical History   Procedure Laterality Date   . Gastric bypass surgery  2009   . Upper gastrointestinal endoscopy  05/2013     [facility]         Allergies   Allergen Reactions   . Shellfish-Derived Products             Objective:  Vitals:    10/20/14 1026   BP: 128/84   Pulse: 69   Weight: (!) 322 lb 6.4 oz (146.2 kg)   Height:  5' 8"  (1.727 m)      Patient's last menstrual period was 09/26/2014.     Mental Status  Exam:    Appearance  well groomed and in no acute distress; smiling  Demeanor Cooperative; pleasant  Activity normal and not restless appearing  Eye Contact good  Speech appropriate; clear; not rapid nor pressured  Mood - reports per above  Affect appropriate; full  Process logical connections; organized  Thought Content  Normal; future oriented  Judgement  good  Insight good  Orientation A & O x 4  Attention Intact  Concentration  Normal  Memory intact  Gait normal  Language fluent and spontaneous without dysarthric features  Fund of Knowledge good      No evidence of SI / HI / psychosis / mania / delusions           ASSESSMENT/PLAN:    1. Episode of recurrent major depressive disorder, unspecified depression episode severity ([place])    Patient will continue wellbutrin as taking for this time and as noted in medication list. The patient was encouraged to f/u with individual therapy as she has considered. I reviewed potential side effects  with medication and potential for increased risk of seizures with taking wellbutrin and while also engaging in alcohol consumption. I advised the patient to avoid alcohol. Patient has not yet started the vitamin d nor iron supplementation so will monitor for any positive effects with energy / motivation as result of starting those in the future (discussed potential features of depression vs. Medical cause for such complaints to monitor)    Dx and plan plan reviewed with consideration to f/u in the future. Patient has rx for medication (wellbutrin) via PCP    FOLLOW-UP: 4 wks; I advised the patient to call prn. I reviewed my role and the patient was agreeable to continue with me.       Goals:   Short Term  - continued improvement in sx.   Long Term - remission of sx.     Risks, benefits, and possible side effects of current treatment including precautions, have been discussed with patient. Also  discussed were alternate treatment options including no treatment changes or possibly use of an adjunct such as zoloft in the future / visits to come.  Patient understands and agrees with plan. Patient agrees to call with any medication question or problem. Patient to call if there is a worsening change in condition or thoughts of self-harm / harm to others. Patient to call 911 in an emergency.       Current Outpatient Prescriptions:   .  ferrous sulfate (FERRO-BOB) 325 (65 FE) MG tablet, Take 1 tablet by mouth 2 times daily (with meals), Disp: 180 tablet, Rfl: 3  .  vitamin D (CHOLECALCIFEROL) 50000 UNIT CAPS, Take 1 capsule by mouth once a week, Disp: 12 capsule, Rfl: 0  .  buPROPion (WELLBUTRIN XL) 300 MG XL tablet, Take 1 tablet by mouth every morning, Disp: 90 tablet, Rfl: 1  .  omeprazole (PRILOSEC) 20 MG capsule, Take 20 mg by mouth daily, Disp: , Rfl:   .  drospirenone-ethinyl estradiol (YAZ) 3-0.02 MG per tablet, Take 1 tablet by mouth daily, Disp: 28 tablet, Rfl: 3

## 2014-10-28 ENCOUNTER — Encounter

## 2014-11-11 ENCOUNTER — Ambulatory Visit: Admit: 2014-11-11 | Discharge: 2014-11-11 | Payer: PRIVATE HEALTH INSURANCE | Attending: Obstetrics

## 2014-11-11 DIAGNOSIS — N879 Dysplasia of cervix uteri, unspecified: Secondary | ICD-10-CM

## 2014-11-11 IMAGING — CR DG CHEST 2V
2 series · 2 of 2 positions shown · non-contrast
Comparison: None.

CLINICAL DATA: Left chest pain

EXAM:
CHEST  2 VIEW

[w chest pa]
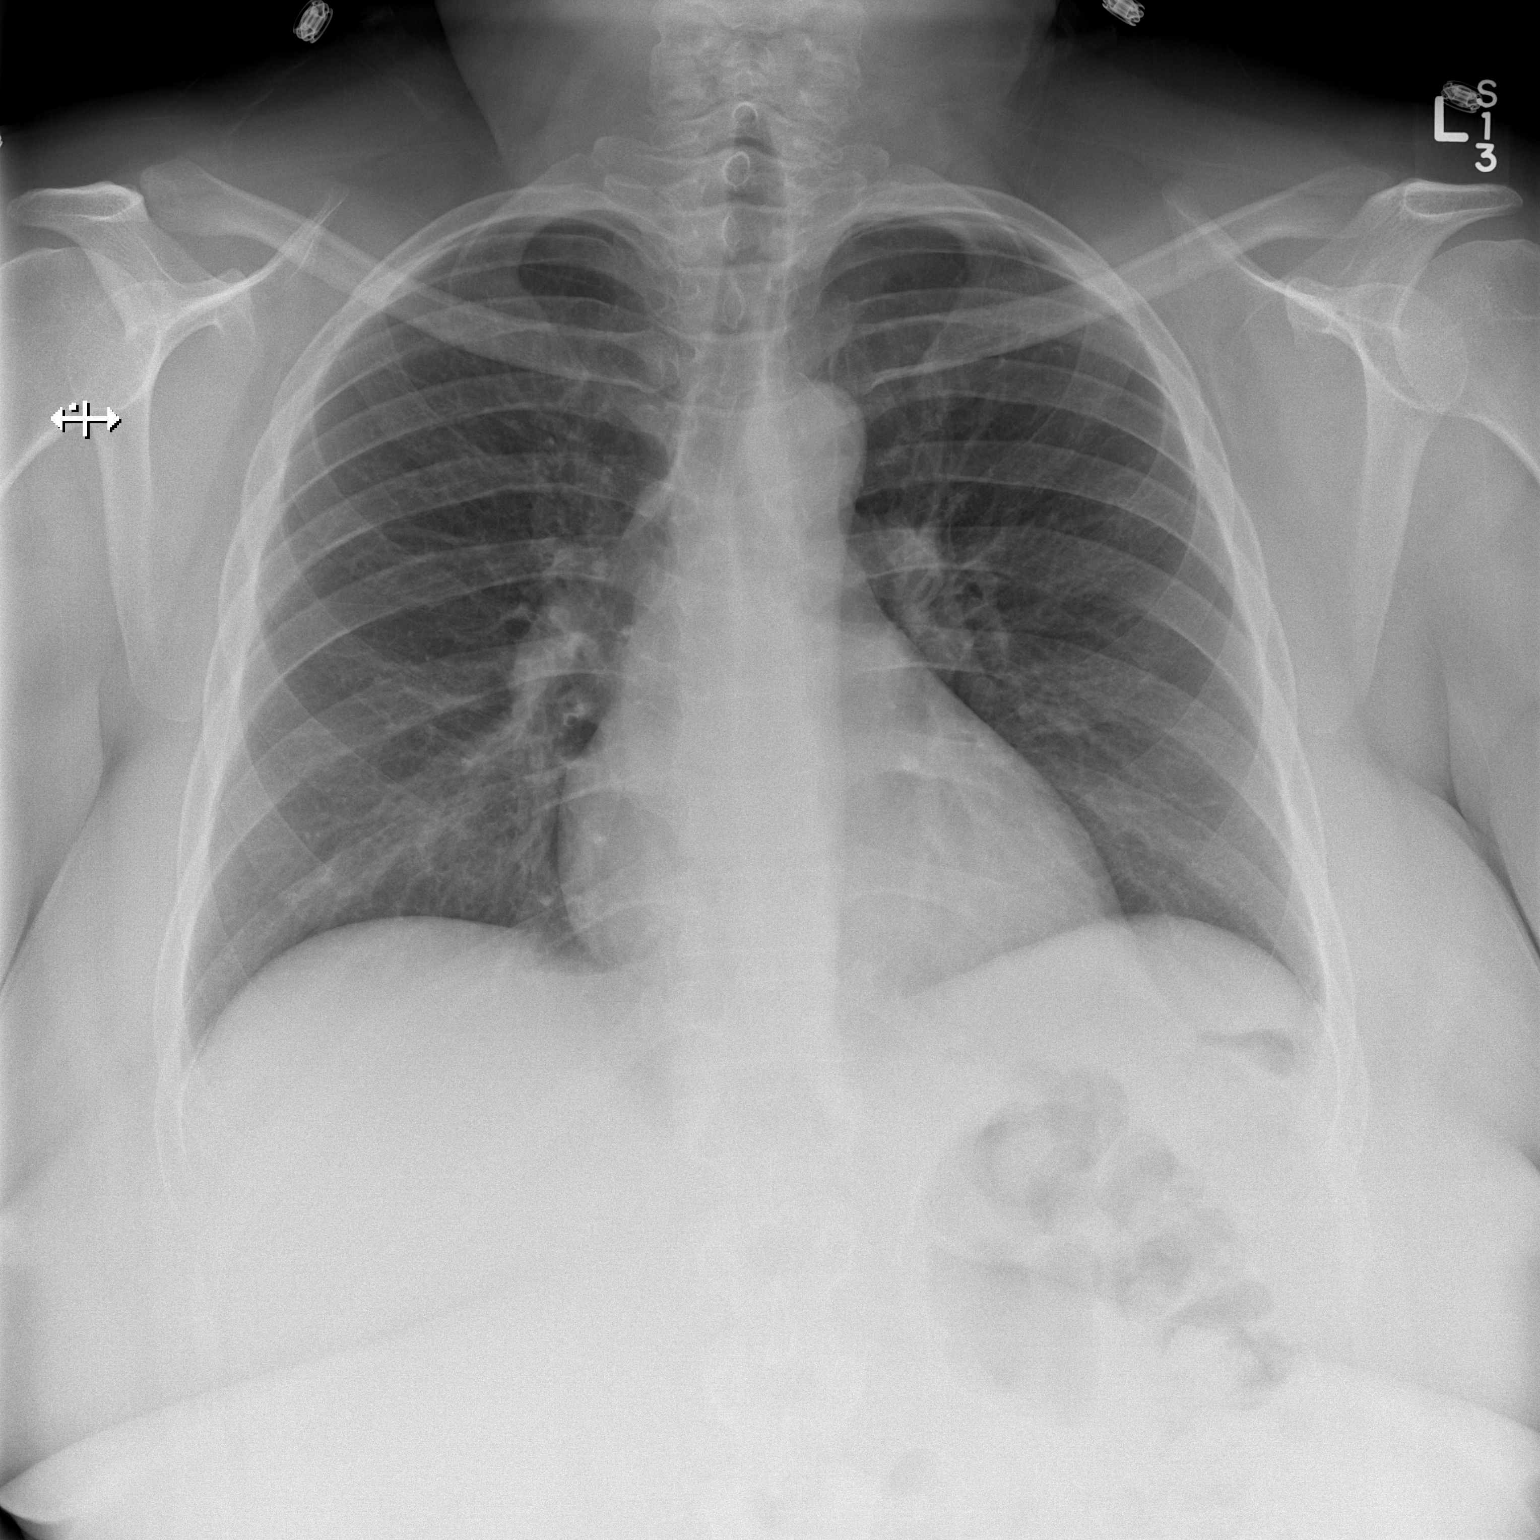

[w chest lat]
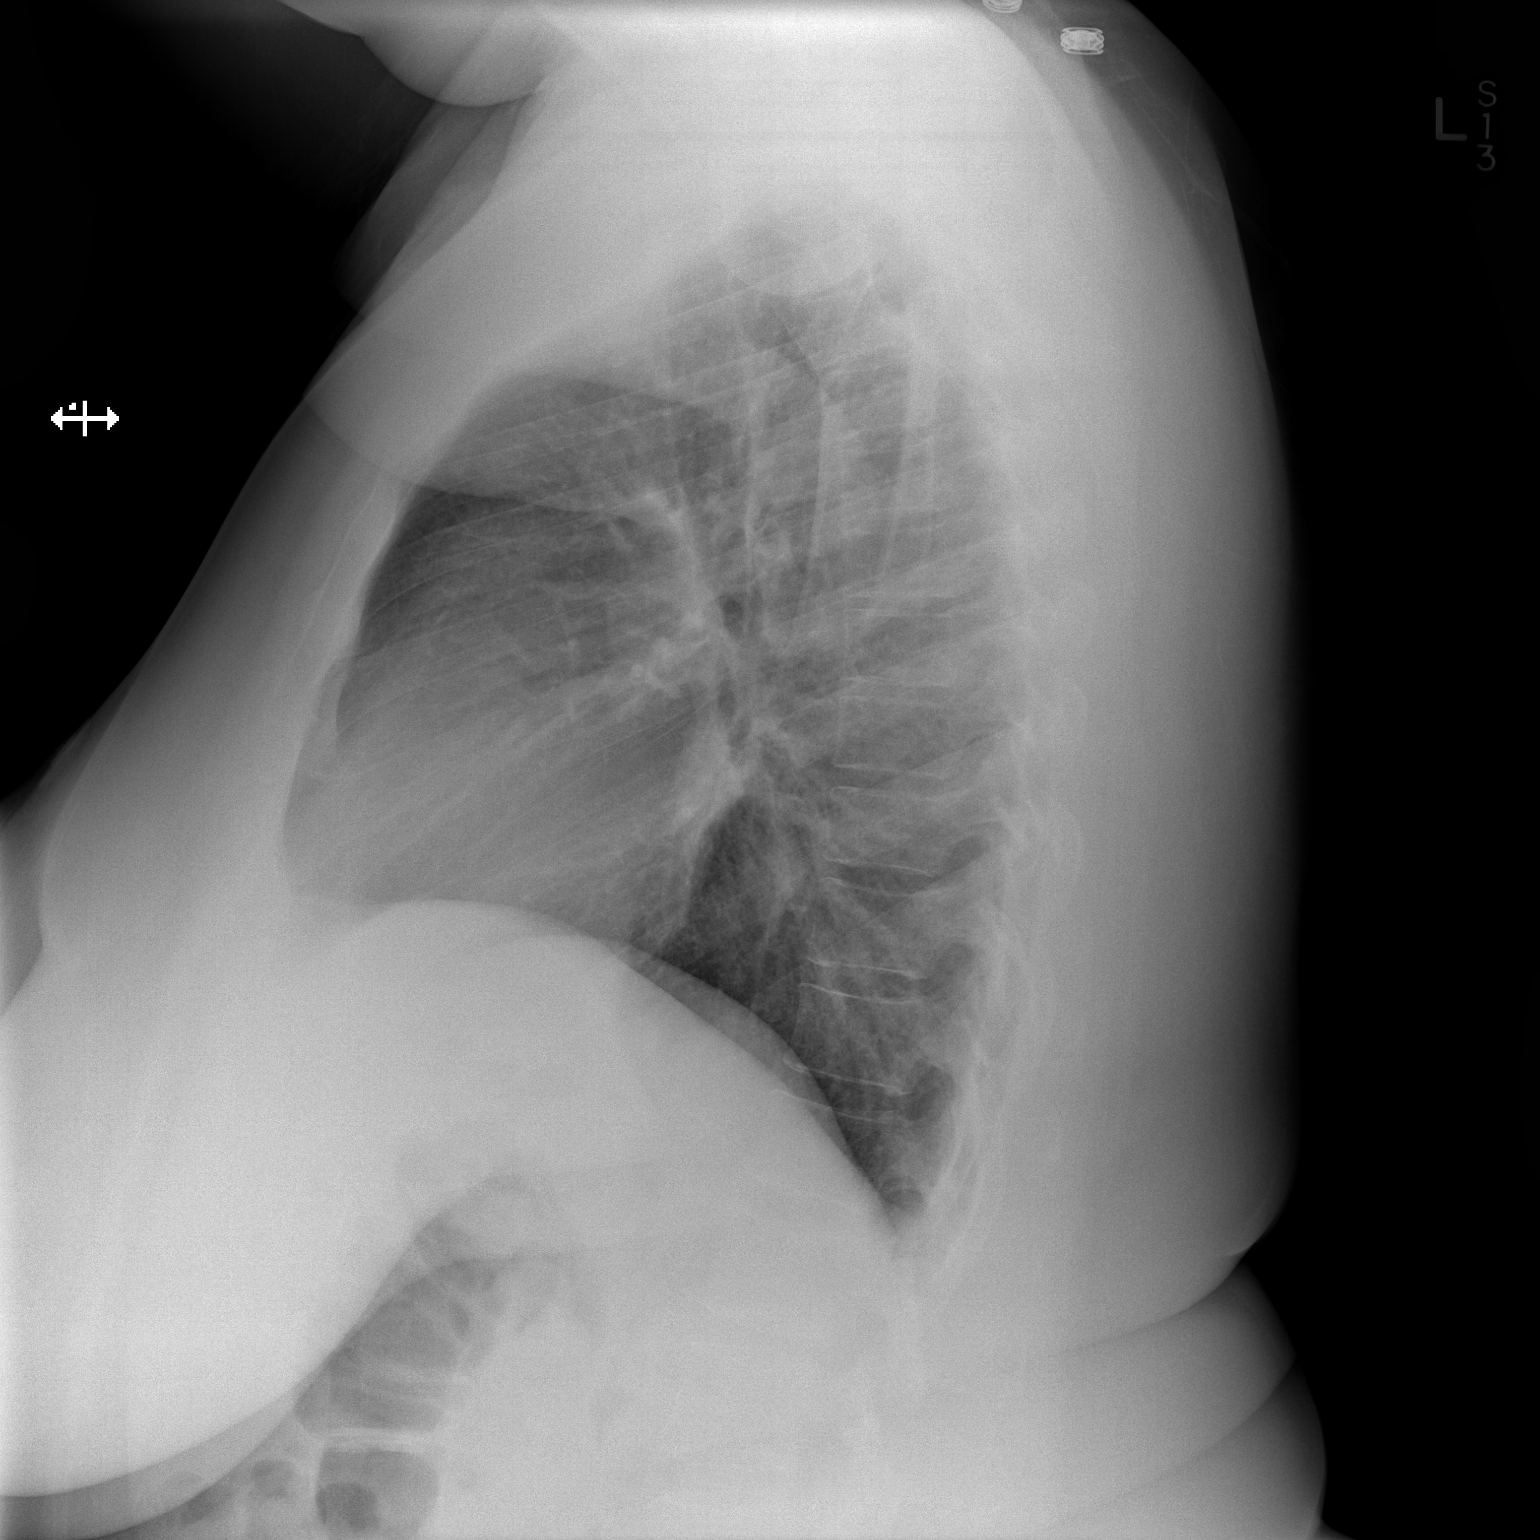

[2 of 2 positions shown; findings below may reference images not displayed]

FINDINGS: The heart size and mediastinal contours are within normal limits.
Both lungs are clear. The visualized skeletal structures are
unremarkable.
IMPRESSION: No active cardiopulmonary disease.

## 2014-11-11 NOTE — Progress Notes (Signed)
COLPOSCOPY    HPI:  39 y.o.  Presents for colposcopy.  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 Anemia    . Contraception management    . Depression    . Headache    . Sleep apnea        Past Surgical History   Procedure Laterality Date   . Gastric bypass surgery  2009   . Upper gastrointestinal endoscopy  05/2013     [facility]       Current Outpatient Prescriptions on File Prior to Visit   Medication Sig Dispense Refill   . ferrous sulfate (FERRO-BOB) 325 (65 FE) MG tablet Take 1 tablet by mouth 2 times daily (with meals) 180 tablet 3   . vitamin D (CHOLECALCIFEROL) 50000 UNIT CAPS Take 1 capsule by mouth once a week 12 capsule 0   . buPROPion (WELLBUTRIN XL) 300 MG XL tablet Take 1 tablet by mouth every morning 90 tablet 1   . omeprazole (PRILOSEC) 20 MG capsule Take 20 mg by mouth daily     . drospirenone-ethinyl estradiol (YAZ) 3-0.02 MG per tablet Take 1 tablet by mouth daily 28 tablet 3     No current facility-administered medications on file prior to visit.        Allergies   Allergen Reactions   . Shellfish-Derived Products        Social History     Social History   . Marital status: Single     Spouse name: N/A   . Number of children: N/A   . Years of education: N/A     Occupational History   . Not on file.     Social History Main Topics   . Smoking status: Never Smoker   . Smokeless tobacco: Not on file   . Alcohol use Yes      Comment: occasionally   . Drug use: No   . Sexual activity: Not on file     Other Topics Concern   . Not on file     Social History Narrative       Family History   Problem Relation Age of Onset   . High Blood Pressure Mother    . Kidney Disease Father    . High Blood Pressure  Father    . Diabetes Father    . No Known Problems Brother    . Cancer Paternal Grandfather      unknown       O: There were no vitals taken for this visit.    COLPOSCOPY PROCEDURE:     Consent: possible risks explained to pt who verbalizes understanding, colposcopy handout education and consent signed.      Urine Hcg: neg    Time out performed: yes    Appropriate size speculum was placed to visualize the cervix and vinegar was applied to the cervix.  Colposcopy was performed in the usual fashion.      FINDINGS:  Cervix: normal appearance  The entire squamo-columnar junction was seen: yes  Acetowhite epithelium: no  Vascular changes: no    Physical  Exam          IMPRESSION:  Satisfactory Colposcopy : yes  normal exam without visible pathology    Please see drawing for details.      Vulva:  normal  Vagina: normal  Peri-anal: normal      Biopsies: ECC      Plan:    RTC for colposcopy follow up    Pt was counseled regarding HPV, mode of transmission and implications.  The need for follow-up surveillance and possible treatment was stressed.  Time was given for answering questions.    Pt information handouts were given.  Condom use was advised to decrease exposure to HPV.  Smoking cessation was advised if applicable.

## 2014-11-11 NOTE — Telephone Encounter (Signed)
Please make sure she knows that her sleep study shows severe sleep apnea and I signed an order to get her back on CPAP.

## 2014-11-16 ENCOUNTER — Telehealth

## 2014-11-16 NOTE — Telephone Encounter (Signed)
Lab needs new order saying ECC as the specimen source not endocervical bx.

## 2014-11-17 ENCOUNTER — Encounter: Attending: Psychiatric/Mental Health

## 2014-11-18 NOTE — Telephone Encounter (Signed)
Patient verbally notified of her test results and will wait until the company call to receive the equipment, she had no questions or concerns she stated that she had been on it before.

## 2014-11-21 LAB — SURGICAL PATHOLOGY

## 2014-12-02 ENCOUNTER — Ambulatory Visit: Admit: 2014-12-02 | Discharge: 2014-12-02 | Payer: PRIVATE HEALTH INSURANCE | Attending: Obstetrics & Gynecology

## 2014-12-02 DIAGNOSIS — R87612 Low grade squamous intraepithelial lesion on cytologic smear of cervix (LGSIL): Secondary | ICD-10-CM

## 2014-12-02 NOTE — Progress Notes (Signed)
Kaitlin Kelly  12/02/2014              39 y.o.  No chief complaint on file.        Patient's last menstrual period was 10/28/2014.           Primary Care Physician: [name], DO  HPI:   Kaitlin Kelly is a 39 y.o. female No obstetric history on file. Pt here for follow up status post colposcopy on 11/11/14. No acute issues.      OB History   No data available     Past Medical History   Diagnosis Date   ??? Anemia    ??? Contraception management    ??? Depression    ??? Headache    ??? Sleep apnea                                                                    Past Surgical History   Procedure Laterality Date   ??? Gastric bypass surgery  2009   ??? Upper gastrointestinal endoscopy  05/2013     [facility]     Family History   Problem Relation Age of Onset   ??? High Blood Pressure Mother    ??? Kidney Disease Father    ??? High Blood Pressure Father    ??? Diabetes Father    ??? No Known Problems Brother    ??? Cancer Paternal Grandfather      unknown     Social History     Social History   ??? Marital status: Single     Spouse name: N/A   ??? Number of children: N/A   ??? Years of education: N/A     Occupational History   ??? Not on file.     Social History Main Topics   ??? Smoking status: Never Smoker   ??? Smokeless tobacco: Not on file   ??? Alcohol use Yes      Comment: occasionally   ??? Drug use: No   ??? Sexual activity: Not on file     Other Topics Concern   ??? Not on file     Social History Narrative         MEDICATIONS:  Current Outpatient Prescriptions   Medication Sig Dispense Refill   ??? vitamin D (ERGOCALCIFEROL) 50000 UNITS CAPS capsule      ??? ferrous sulfate (FERRO-BOB) 325 (65 FE) MG tablet Take 1 tablet by mouth 2 times daily (with meals) 180 tablet 3   ??? vitamin D (CHOLECALCIFEROL) 50000 UNIT CAPS Take 1 capsule by mouth once a week 12 capsule 0   ??? buPROPion (WELLBUTRIN XL) 300 MG XL tablet Take 1 tablet by mouth every morning 90 tablet 1   ??? omeprazole (PRILOSEC) 20 MG capsule Take 20 mg by mouth daily     ???  drospirenone-ethinyl estradiol (YAZ) 3-0.02 MG per tablet Take 1 tablet by mouth daily 28 tablet 3     No current facility-administered medications for this visit.        ALLERGIES:  Allergies as of 12/02/2014 - Review Complete 11/11/2014   Allergen Reaction Noted   ??? Shellfish-derived products  10/20/2014       Review of Systems:  Review of Systems   1)General-Denies fever, chills, fatigue, weight change  2)CV-Denies chest pain, chest discomfort  3)Lungs-Denies shortness of breath  4)Abdomen-Denies abdominal pain, nausea, vomiting, change in bowels habits      Physical Exam:    Visit Vitals   ??? LMP 10/28/2014        Physical Exam   General:NAD. A&Ox3  Lungs:Normal respiratory effort.  Extremity- Normal range of motion, no gross deficits.      Recent Results (from the past 1008 hour(s))   Surgical Pathology    Collection Time: 11/11/14  2:50 PM   Result Value Ref Range    Surgical Pathology Report SEE [id number]   ]    11/11/14  ENDOCERVIX, CURETTINGS - UNREMARKABLE ENDOCERVICAL MUCOSA.    ASSESSMENT:      39 y.o. No obstetric history on file.  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 Colp satisfactory. No Biopsy. ECC Neg. Plan for cotecint in 1 year (10/2015) Pt agreeable. Counseled on HPV transmission. Discussed smoking cessation.  No Follow-up on file.    No orders of the defined types were placed in this encounter.

## 2014-12-26 MED ORDER — DROSPIRENONE-ETHINYL ESTRADIOL 3-0.02 MG PO TABS
ORAL_TABLET | Freq: Every day | ORAL | 0 refills | Status: DC
Start: 2014-12-26 — End: 2015-01-06

## 2014-12-26 NOTE — Telephone Encounter (Signed)
Pt has an appt with you on 01/06/15 but her RX of Yaz birth control  will run out before then,  Can you send in an Rx for her.  To [facility]

## 2014-12-26 NOTE — Telephone Encounter (Signed)
Notified Pt

## 2014-12-26 NOTE — Telephone Encounter (Signed)
Rx Yaz sent to pharmacy.

## 2015-01-06 ENCOUNTER — Ambulatory Visit: Admit: 2015-01-06 | Payer: PRIVATE HEALTH INSURANCE | Attending: Women's Health

## 2015-01-06 DIAGNOSIS — Z309 Encounter for contraceptive management, unspecified: Secondary | ICD-10-CM

## 2015-01-06 MED ORDER — DROSPIRENONE-ETHINYL ESTRADIOL 3-0.02 MG PO TABS
ORAL_TABLET | Freq: Every day | ORAL | 9 refills | Status: DC
Start: 2015-01-06 — End: 2015-07-18

## 2015-01-06 NOTE — Progress Notes (Signed)
Chief Complaint   Patient presents with   . Follow-up     Follow up for birth control refill       Patient's last menstrual period was 12/28/2014 (approximate).     History:    Past Medical History   Diagnosis Date   . Anemia    . Contraception management    . Depression    . Headache    . Sleep apnea    . Sleep apnea 2016       Past Surgical History   Procedure Laterality Date   . Gastric bypass surgery  2009   . Upper gastrointestinal endoscopy  05/2013     [facility]   . Gastric bypass surgery  2009       Family History   Problem Relation Age of Onset   . High Blood Pressure Mother    . Kidney Disease Father    . High Blood Pressure Father    . Diabetes Father    . No Known Problems Brother    . Cancer Paternal Grandfather      unknown       Social History     Social History   . Marital status: Single     Spouse name: N/A   . Number of children: N/A   . Years of education: N/A     Social History Main Topics   . Smoking status: Never Smoker   . Smokeless tobacco: None   . Alcohol use Yes      Comment: occasionally   . Drug use: No   . Sexual activity: Not Asked     Other Topics Concern   . None     Social History Narrative       Allergies:    Allergies   Allergen Reactions   . Shellfish-Derived Products        Medications:    Current Outpatient Prescriptions on File Prior to Visit   Medication Sig Dispense Refill   . buPROPion (WELLBUTRIN XL) 300 MG XL tablet Take 1 tablet by mouth every morning 90 tablet 1   . omeprazole (PRILOSEC) 20 MG capsule Take 20 mg by mouth daily     . vitamin D (ERGOCALCIFEROL) 50000 UNITS CAPS capsule      . ferrous sulfate (FERRO-BOB) 325 (65 FE) MG tablet Take 1 tablet by mouth 2 times daily (with meals) 180 tablet 3   . vitamin D (CHOLECALCIFEROL) 50000 UNIT CAPS Take 1 capsule by mouth once a week 12 capsule 0     No current facility-administered medications on file prior to visit.        HPI:    HPI Comments: Pt here for BP check.  Pt placed on COC's at her annual in August.   Denies HA's, chest pain, SOB, abdominal pain, and leg pains.  Reports that she is satisfied and would like to continue.  Pt with hx of gastric bypass and is aware that is some absorption and effectiveness concerns.      ROS:    Review of Systems   Constitutional: Negative.    Respiratory: Negative for chest tightness and shortness of breath.    Neurological: Negative for headaches.       Physical exam:    Visit Vitals   . BP 133/72 (Site: Left Arm, Position: Sitting, Cuff Size: Large Adult)   . Pulse 92   . Ht 5' 8"  (1.727 m)   . Wt (!) 323 lb 9.6 oz (146.8  kg)   . LMP 12/28/2014 (Approximate)   . BMI 49.2 kg/m2       Physical Exam   Constitutional: She is oriented to person, place, and time. She appears well-developed and well-nourished.   HENT:   Head: Normocephalic and atraumatic.   Neck: Normal range of motion.   Pulmonary/Chest: Effort normal.   Musculoskeletal: Normal range of motion.   Neurological: She is alert and oriented to person, place, and time.   Skin: Skin is warm and dry.   Psychiatric: She has a normal mood and affect.       Assessment and Plan:    Mertice was seen today for follow-up.    Diagnoses and all orders for this visit:    Contraception management  -     drospirenone-ethinyl estradiol (YAZ) 3-0.02 MG per tablet; Take 1 tablet by mouth daily        Return in about 9 months (around 10/09/2015) for pap.

## 2015-01-22 ENCOUNTER — Encounter

## 2015-02-08 ENCOUNTER — Ambulatory Visit: Admit: 2015-02-08 | Discharge: 2015-02-08 | Payer: PRIVATE HEALTH INSURANCE | Attending: Family Medicine

## 2015-02-08 DIAGNOSIS — G4733 Obstructive sleep apnea (adult) (pediatric): Secondary | ICD-10-CM

## 2015-02-08 MED ORDER — FLUCONAZOLE 150 MG PO TABS
150 MG | ORAL_TABLET | Freq: Once | ORAL | 1 refills | Status: AC
Start: 2015-02-08 — End: 2015-02-08

## 2015-02-08 NOTE — Progress Notes (Signed)
Patient has seen DR. O'Neil ([place]) on 10/20/14, and on multiple occasions on 11/11/14, 12/02/14,  And 01/06/15. Patient was also seen at [facility] on 02/01/15 due cellulitis in right leg. Patient is currently taking antibiotic, patient did not bring bottle and does not remember the name.

## 2015-02-08 NOTE — Progress Notes (Signed)
Subjective:     Patient: Kaitlin Kelly is a 39 y.o. female     HPI   She got started back on CPAP in October after being off of it for a few years.  She is tolerating it well and has more energy.  She is using it nightly, but has not been using it this week due to nasal congestion from a cold.    She went to an urgent care on 12/14 with right lower leg swelling and redness.  She did not have a deep vein thrombosis, but was diagnosed with cellulitis.  She is currently on cephalexin and Bactrim for another 4 days.  She still has the marker on her leg from where the redness was, and it has receded by about half.    She is having vaginal itching and discharge since starting the antibiotics.    She is now seeing a counselor for her depression.  She has had a little bit more crying spells lately and feels this may be due to the weather.  She plans to continue with counseling.  She wants to stay with just Wellbutrin for now.    Review of Systems   Constitutional: Negative for fatigue and unexpected weight change.   HENT: Positive for congestion, rhinorrhea, sinus pressure and sore throat.    Eyes: Negative for pain and visual disturbance.   Respiratory: Positive for cough. Negative for shortness of breath.    Cardiovascular: Positive for leg swelling. Negative for chest pain and palpitations.   Gastrointestinal: Negative for blood in stool, constipation and diarrhea.   Genitourinary: Positive for vaginal discharge. Negative for vaginal pain.   Skin: Positive for color change. Negative for wound.   Neurological: Negative for weakness, light-headedness and headaches.   Psychiatric/Behavioral: Positive for dysphoric mood. The patient is not nervous/anxious.         Allergies   Allergen Reactions   . Shellfish-Derived Products      Outpatient Medications Prior to Visit   Medication Sig Dispense Refill   . drospirenone-ethinyl estradiol (YAZ) 3-0.02 MG per tablet Take 1 tablet by mouth daily 28 tablet 9   . buPROPion  (WELLBUTRIN XL) 300 MG XL tablet Take 1 tablet by mouth every morning 90 tablet 1   . omeprazole (PRILOSEC) 20 MG capsule Take 20 mg by mouth daily     . vitamin D (ERGOCALCIFEROL) 50000 UNITS CAPS capsule      . ferrous sulfate (FERRO-BOB) 325 (65 FE) MG tablet Take 1 tablet by mouth 2 times daily (with meals) 180 tablet 3   . vitamin D (CHOLECALCIFEROL) 50000 UNIT CAPS Take 1 capsule by mouth once a week 12 capsule 0     No facility-administered medications prior to visit.       Past Medical History   Diagnosis Date   . Anemia    . Contraception management    . Depression    . Headache    . Sleep apnea    . Sleep apnea 2016      Past Surgical History   Procedure Laterality Date   . Gastric bypass surgery  2009   . Upper gastrointestinal endoscopy  05/2013     [facility]   . Gastric bypass surgery  2009     Family History   Problem Relation Age of Onset   . High Blood Pressure Mother    . Kidney Disease Father    . High Blood Pressure Father    . Diabetes Father    .  No Known Problems Brother    . Cancer Paternal Grandfather      unknown     Social History   Substance Use Topics   . Smoking status: Never Smoker   . Smokeless tobacco: Not on file   . Alcohol use Yes      Comment: occasionally          Objective:     Visit Vitals   . BP 131/81 (Site: Left Arm, Position: Sitting, Cuff Size: Thigh)   . Pulse 77   . Temp 98.4 F (36.9 C) (Tympanic)   . Ht 5' 8"  (1.727 m)   . Wt (!) 316 lb (143.3 kg)   . LMP 01/25/2015   . BMI 48.05 kg/m2       Physical Exam   Constitutional: She is oriented to person, place, and time. She appears well-developed and well-nourished. No distress.   HENT:   Head: Normocephalic and atraumatic.   Right Ear: Tympanic membrane, external ear and ear canal normal.   Left Ear: Tympanic membrane, external ear and ear canal normal.   Mouth/Throat: Oropharynx is clear and moist and mucous membranes are normal. No oropharyngeal exudate.   Eyes: Conjunctivae and EOM are normal. Right eye exhibits  no discharge. Left eye exhibits no discharge.   Neck: Normal range of motion. Neck supple.   Cardiovascular: Normal rate, regular rhythm and normal heart sounds.    Pulmonary/Chest: Effort normal and breath sounds normal. She has no wheezes. She has no rales.   Musculoskeletal: She exhibits edema.   Lymphadenopathy:        Head (right side): No submandibular adenopathy present.        Head (left side): No submandibular adenopathy present.     She has no cervical adenopathy.        Right cervical: No superficial cervical and no posterior cervical adenopathy present.       Left cervical: No superficial cervical and no posterior cervical adenopathy present.   Neurological: She is alert and oriented to person, place, and time.   Skin: Skin is warm and dry. No rash noted. She is not diaphoretic. There is erythema.   Erythema right lower leg from mid-shin down   Psychiatric: Her speech is normal and behavior is normal. Her affect is blunt.       Assessment      1. OSA (obstructive sleep apnea)    2. Cellulitis of right leg    3. Vaginal yeast infection    4. Acute nasopharyngitis    5. Recurrent major depressive disorder, in partial remission ([place])         Plan      1. OSA (obstructive sleep apnea)    2. Cellulitis of right leg    3. Vaginal yeast infection  - fluconazole (DIFLUCAN) 150 MG tablet; Take 1 tablet by mouth once for 1 dose  Dispense: 1 tablet; Refill: 1    4. Acute nasopharyngitis    5. Recurrent major depressive disorder, in partial remission ([place])    She will continue the antibiotics for cellulitis.  She will call if the redness worsens after finishing the antibiotics.    [name], DO  02/08/15  12:18 PM

## 2015-02-09 NOTE — Telephone Encounter (Signed)
Routed to [employer]

## 2015-03-21 ENCOUNTER — Ambulatory Visit: Admit: 2015-03-21 | Discharge: 2015-04-24 | Payer: PRIVATE HEALTH INSURANCE | Attending: Women's Health

## 2015-03-21 DIAGNOSIS — N898 Other specified noninflammatory disorders of vagina: Secondary | ICD-10-CM

## 2015-03-21 LAB — POCT URINE PREGNANCY: Preg Test, Ur: NEGATIVE

## 2015-03-21 NOTE — Progress Notes (Signed)
Chief Complaint   Patient presents with   . Gynecologic Exam     Having discharge a month ago.       Patient's last menstrual period was 03/20/2014.     History:    Past Medical History   Diagnosis Date   . Anemia    . Contraception management    . Depression    . Headache    . Sleep apnea    . Sleep apnea 2016       Past Surgical History   Procedure Laterality Date   . Gastric bypass surgery  2009   . Upper gastrointestinal endoscopy  05/2013     [facility]   . Gastric bypass surgery  2009       Family History   Problem Relation Age of Onset   . High Blood Pressure Mother    . Kidney Disease Father    . High Blood Pressure Father    . Diabetes Father    . No Known Problems Brother    . Cancer Paternal Grandfather      unknown       Social History     Social History   . Marital status: Single     Spouse name: N/A   . Number of children: N/A   . Years of education: N/A     Social History Main Topics   . Smoking status: Never Smoker   . Smokeless tobacco: None   . Alcohol use Yes      Comment: occasionally   . Drug use: No   . Sexual activity: Not Asked     Other Topics Concern   . None     Social History Narrative       Allergies:    Allergies   Allergen Reactions   . Shellfish-Derived Products        Medications:    Current Outpatient Prescriptions on File Prior to Visit   Medication Sig Dispense Refill   . drospirenone-ethinyl estradiol (YAZ) 3-0.02 MG per tablet Take 1 tablet by mouth daily 28 tablet 9   . vitamin D (ERGOCALCIFEROL) 50000 UNITS CAPS capsule      . ferrous sulfate (FERRO-BOB) 325 (65 FE) MG tablet Take 1 tablet by mouth 2 times daily (with meals) 180 tablet 3   . vitamin D (CHOLECALCIFEROL) 50000 UNIT CAPS Take 1 capsule by mouth once a week 12 capsule 0   . omeprazole (PRILOSEC) 20 MG capsule Take 20 mg by mouth daily     . buPROPion (WELLBUTRIN XL) 300 MG XL tablet Take 1 tablet by mouth every morning 90 tablet 1     No current facility-administered medications on file prior to visit.         HPI:    HPI Comments: Pt with c/o brown discharge x1 month.  Denies itching and odor.  Pt also notes that her periods have been longer since starting her Yaz in August.  States normally her periods will last 3-4 days, but they are now 7 days.  Reports they are ligh.  Denies any pelvic pain.  Denies changes in bladder and bowels.  Denies fevers, chills, N/V.  Has not been sexually active since New Years per pt.      Gynecologic Exam   Pertinent negatives include no nausea or vomiting.       ROS:    Review of Systems   Constitutional: Negative.    Gastrointestinal: Negative for constipation, diarrhea, nausea and vomiting.  Genitourinary: Positive for menstrual problem, vaginal bleeding and vaginal discharge. Negative for decreased urine volume, difficulty urinating, dysuria and vaginal pain.       Physical exam:    Visit Vitals   . BP 108/70 (Position: Sitting)   . Pulse 69   . Ht 5' 9.6" (1.768 m)   . Wt (!) 322 lb 12.8 oz (146.4 kg)   . LMP 03/20/2014   . BMI 46.85 kg/m2       Physical Exam   Constitutional: She is oriented to person, place, and time. She appears well-developed and well-nourished.   HENT:   Head: Normocephalic and atraumatic.   Neck: Normal range of motion.   Pulmonary/Chest: Effort normal.   Abdominal: Soft.   Genitourinary: No labial fusion. There is no rash, tenderness, lesion or injury on the right labia. There is no rash, tenderness, lesion or injury on the left labia. Uterus is not deviated, not enlarged, not fixed and not tender. Cervix exhibits no motion tenderness, no discharge and no friability. Right adnexum displays no mass, no tenderness and no fullness. Left adnexum displays no mass, no tenderness and no fullness. There is bleeding in the vagina. No erythema or tenderness in the vagina. No foreign body in the vagina. No signs of injury around the vagina. Vaginal discharge found.   Genitourinary Comments: Exam limited by habitus.  Small brown discharge/old blood.    Musculoskeletal: Normal range of motion.   Neurological: She is alert and oriented to person, place, and time.   Skin: Skin is warm and dry.   Psychiatric: She has a normal mood and affect.       Assessment and Plan:    Kaitlin Kelly was seen today for gynecologic exam.    Diagnoses and all orders for this visit:    Vaginal discharge  -     Vaginal Pathogens Probes *A  -     C. Trachomatis / N. Gonorrhoeae, DNA Probe    Abnormal uterine bleeding (AUB)  -     POCT urine pregnancy  -     US Pelvis Complete    Discussed with patient that I am concerned her brown discharge may be irregular bleeding.  Consequently, I would like to order U/S to r/o other pathology.    Return for Needs f/u with resident after pelvic U/S.  Marland Kitchen

## 2015-03-22 LAB — C. TRACHOMATIS / N. GONORRHOEAE, DNA
C. Trachomatis Amplified: NEGATIVE NA
N. Gonorrhoeae Amplified: NEGATIVE NA

## 2015-03-22 LAB — VAGINAL PATHOGENS PROBE *A
Candida Species, DNA Probe: NEGATIVE
Gardnerella Vaginalis, DNA Probe: NEGATIVE
Trichomonas Vaginalis DNA: NEGATIVE

## 2015-03-24 NOTE — Telephone Encounter (Signed)
Patient scheduled for GYN [place] 04/12/15 @ 1:00 pm Drink 32 oz of water 1 1/2hrs prior to appt  Pt notified  With understanding

## 2015-04-10 NOTE — Telephone Encounter (Signed)
Patient changed [place] appt to March 8 at 8:00am told to drink 32 oz of water 1 1/2 prior to appt

## 2015-04-24 NOTE — Telephone Encounter (Signed)
Pt scheduled for Pelvic Complete for Abnormal Uterine Bleeding on 04/26/15 @ 8am.  Pt canceled the appt, stating she is not having this problem anymore so she doesn't feel she needs it.

## 2015-06-09 ENCOUNTER — Encounter: Attending: Family Medicine

## 2015-06-21 ENCOUNTER — Ambulatory Visit: Admit: 2015-06-21 | Discharge: 2015-06-21 | Payer: PRIVATE HEALTH INSURANCE | Attending: Family Medicine

## 2015-06-21 DIAGNOSIS — S8012XA Contusion of left lower leg, initial encounter: Secondary | ICD-10-CM

## 2015-06-21 MED ORDER — BUPROPION HCL ER (XL) 300 MG PO TB24
300 MG | ORAL_TABLET | Freq: Every morning | ORAL | 1 refills | Status: DC
Start: 2015-06-21 — End: 2015-08-30

## 2015-06-21 NOTE — Telephone Encounter (Signed)
Error.

## 2015-06-21 NOTE — Progress Notes (Signed)
On the basis of positive PHQ-9 screening (PHQ-9 Total Score: 10), the following plan was implemented: continue with counselor and bupropion.  Patient will follow-up in 2 week(s) with psychologist.

## 2015-06-21 NOTE — Progress Notes (Signed)
Subjective:     Patient: Kaitlin Kelly is a 40 y.o. female     HPI   Patient is here for a follow-up on depression.  She has been on bupropion for over a year, and feels that it is working well.  She did establish care with a counselor in September, and has a follow-up visit in 2 weeks.  She states that the counseling is helping.  She denies any effect of her depression on her work or home life.  She denies suicidal thoughts.    She states she was getting on a boat about 2 weeks ago, and bumped her left shin.  She had a large bruise.  Now she has a hard indurated area where the bruising was.  It is tender to touch still.    She recently noticed a tender mass on her right medial lower breast.  She was able to squeeze a little bit of purulent material from it, but now it is not draining and is not getting smaller.  She did have a normal screening mammogram in November.    She has a history of gastric bypass, and had her yearly lab work done in August.  Her iron was low.  She is taking iron almost every day, but gets constipated if she takes more than that.  She denies any trouble with energy levels since getting back on CPAP.    Review of Systems   Constitutional: Negative for fatigue and unexpected weight change.   Cardiovascular: Positive for leg swelling.   Musculoskeletal: Negative for arthralgias and myalgias.   Skin: Positive for color change. Negative for wound.   Neurological: Negative for dizziness and light-headedness.   Psychiatric/Behavioral: Positive for dysphoric mood. Negative for sleep disturbance and suicidal ideas. The patient is not nervous/anxious.         Allergies   Allergen Reactions   . Shellfish-Derived Products      Outpatient Medications Prior to Visit   Medication Sig Dispense Refill   . drospirenone-ethinyl estradiol (YAZ) 3-0.02 MG per tablet Take 1 tablet by mouth daily 28 tablet 9   . ferrous sulfate (FERRO-BOB) 325 (65 FE) MG tablet Take 1 tablet by mouth 2 times daily (with meals) 180  tablet 3   . omeprazole (PRILOSEC) 20 MG capsule Take 20 mg by mouth daily     . vitamin D (ERGOCALCIFEROL) 50000 UNITS CAPS capsule      . vitamin D (CHOLECALCIFEROL) 50000 UNIT CAPS Take 1 capsule by mouth once a week 12 capsule 0   . buPROPion (WELLBUTRIN XL) 300 MG XL tablet Take 1 tablet by mouth every morning 90 tablet 1     No facility-administered medications prior to visit.       Past Medical History:   Diagnosis Date   . Anemia    . Contraception management    . Depression    . Headache    . Sleep apnea    . Sleep apnea 2016      Past Surgical History:   Procedure Laterality Date   . GASTRIC BYPASS SURGERY  2009   . GASTRIC BYPASS SURGERY  2009   . UPPER GASTROINTESTINAL ENDOSCOPY  05/2013    [facility]     Family History   Problem Relation Age of Onset   . High Blood Pressure Mother    . Kidney Disease Father    . High Blood Pressure Father    . Diabetes Father    . No Known Problems  Brother    . Cancer Paternal Grandfather      unknown     Social History   Substance Use Topics   . Smoking status: Never Smoker   . Smokeless tobacco: Not on file   . Alcohol use Yes      Comment: occasionally          Objective:     BP 116/81 (Site: Left Arm, Position: Sitting, Cuff Size: Large Adult)  Pulse 66  Temp 98.1 F (36.7 C) (Tympanic)   Wt (!) 320 lb (145.2 kg)  BMI 46.44 kg/m2    Physical Exam   Constitutional: She is oriented to person, place, and time. She appears well-developed and well-nourished.   HENT:   Head: Normocephalic and atraumatic.   Eyes: Conjunctivae and EOM are normal. Pupils are equal, round, and reactive to light.   Neck: Normal range of motion. Neck supple. No thyromegaly present.   Cardiovascular: Normal rate, regular rhythm, normal heart sounds and intact distal pulses.    No murmur heard.  Pulmonary/Chest: Effort normal and breath sounds normal. She has no wheezes. She has no rales. She exhibits no tenderness.   Musculoskeletal: Normal range of motion. She exhibits edema. She  exhibits no tenderness.   Mild pitting edema bilateral lower extremities, firm mass left lower anterior shin ~4cm diameter with mild hyperpigmentation       Lymphadenopathy:     She has no cervical adenopathy.   Neurological: She is alert and oriented to person, place, and time. No cranial nerve deficit. She exhibits normal muscle tone. Coordination normal.   Skin: Skin is warm and dry.   1cm fluctuant mass 4o'clock position right breast   Psychiatric: She has a normal mood and affect. Her behavior is normal.       Assessment      1. Hematoma of leg, left, initial encounter    2. Cyst of skin of breast, right    3. Iron deficiency anemia secondary to inadequate dietary iron intake    4. Positive depression screening    5. Recurrent major depressive disorder, in partial remission ([place])    6. OSA (obstructive sleep apnea)         Plan      1. Hematoma of leg, left, initial encounter    2. Cyst of skin of breast, right    3. Iron deficiency anemia secondary to inadequate dietary iron intake    4. Positive depression screening  - Positive Screen for Clinical Depression with a Documented Follow-up Plan [id number]    5. Recurrent major depressive disorder, in partial remission ([place])    6. OSA (obstructive sleep apnea)    She will let me know if the hematoma on her leg or the cyst on her breasts does not get better within a month.  If the breast cyst is still there, she will be referred to breast surgery for removal.  If she starts feeling tired again, I will recheck her iron.  If she does not tolerate oral iron, she could be a candidate for IV iron.    [name], DO  06/21/15  1:35 PM

## 2015-07-18 MED ORDER — DROSPIRENONE-ETHINYL ESTRADIOL 3-0.02 MG PO TABS
ORAL_TABLET | Freq: Every day | ORAL | 1 refills | Status: DC
Start: 2015-07-18 — End: 2015-10-10

## 2015-07-18 NOTE — Telephone Encounter (Signed)
Rx sent to pharmacy.  She will need pap after 10/07/15.  Also, she was supposed to have and U/S done.  Please encourage her to have done.  Thanks.

## 2015-07-18 NOTE — Telephone Encounter (Signed)
CAll to patient and LM for patient to call whc

## 2015-07-18 NOTE — Telephone Encounter (Signed)
Received request for a 90 day supply Loryna birth control for insurance Patient has refills but they are written for monthly refills To kim to [employer]

## 2015-07-19 NOTE — Telephone Encounter (Signed)
Patient notified that RX was sent to her pharmacy for a 90 day supply  Birth control and patient scheduled for pap test 10/09/15 @ 9 AM. Agrees to appt date and time

## 2015-08-02 ENCOUNTER — Ambulatory Visit: Admit: 2015-08-02 | Discharge: 2015-08-02 | Payer: PRIVATE HEALTH INSURANCE | Attending: Family Medicine

## 2015-08-02 DIAGNOSIS — H1131 Conjunctival hemorrhage, right eye: Secondary | ICD-10-CM

## 2015-08-02 NOTE — Patient Instructions (Signed)
Subconjunctival Hemorrhage: Care Instructions  Your Care Instructions    Sometimes small blood vessels in the white of the eye can break, causing a red spot or speck. This is called a subconjunctival hemorrhage. The blood vessels may break when you sneeze, cough, vomit, strain, or bend over. Sometimes there is no clear cause.  The blood may look alarming, especially if the spot is large. If there is no pain or vision change, there is usually no reason to worry, and the blood slowly will go away on its own in 2 to 3 weeks.  Follow-up care is a key part of your treatment and safety. Be sure to make and go to all appointments, and call your doctor if you are having problems. It's also a good idea to know your test results and keep a list of the medicines you take.  How can you care for yourself at home?   Watch for changes in your eye. It is normal for the red spot on your eyeball to change color as it heals. Just like a bruise on your skin, it may change from red to brown to purple to yellow.   Do not take aspirin or products that contain aspirin, which can increase bleeding. Use acetaminophen (Tylenol) if you need pain relief for another problem.   Do not take two or more pain medicines at the same time unless the doctor told you to. Many pain medicines have acetaminophen, which is Tylenol. Too much acetaminophen (Tylenol) can be harmful.  When should you call for help?  Call your doctor now or seek immediate medical care if:   You see blood over the black part of your eye (pupil).   You have any changes or problems in your vision.   You have any pain in your eye.   You have any discharge from your eye.  Watch closely for changes in your health, and be sure to contact your doctor if:   Your eye color is not steadily returning to normal.   The blood has not gone away after 2 to 3 weeks.   You develop bruising or bleeding elsewhere, such as the gums or the skin, or you have nosebleeds.  Where can you  learn more?  Go to [email or web address] and sign in to your MyChart account. Enter [phone] in the [place] box to learn more about "Subconjunctival Hemorrhage: Care Instructions."     If you do not have an account, please click on the "Sign Up Now" link.  Current as of: Jul 11, 2014  Content Version: 11.2   2006-2017 [employer]. Care instructions adapted under license by [facility]. If you have questions about a medical condition or this instruction, always ask your healthcare professional. [place] any warranty or liability for your use of this information.       Subconjunctival Hemorrhage: Care Instructions  Your Care Instructions    Sometimes small blood vessels in the white of the eye can break, causing a red spot or speck. This is called a subconjunctival hemorrhage. The blood vessels may break when you sneeze, cough, vomit, strain, or bend over. Sometimes there is no clear cause.  The blood may look alarming, especially if the spot is large. If there is no pain or vision change, there is usually no reason to worry, and the blood slowly will go away on its own in 2 to 3 weeks.  Follow-up care is a key part of your treatment and safety. Be sure  to make and go to all appointments, and call your doctor if you are having problems. It's also a good idea to know your test results and keep a list of the medicines you take.  How can you care for yourself at home?   Watch for changes in your eye. It is normal for the red spot on your eyeball to change color as it heals. Just like a bruise on your skin, it may change from red to brown to purple to yellow.   Do not take aspirin or products that contain aspirin, which can increase bleeding. Use acetaminophen (Tylenol) if you need pain relief for another problem.   Do not take two or more pain medicines at the same time unless the doctor told you to. Many pain medicines have acetaminophen, which  is Tylenol. Too much acetaminophen (Tylenol) can be harmful.  When should you call for help?  Call your doctor now or seek immediate medical care if:   You see blood over the black part of your eye (pupil).   You have any changes or problems in your vision.   You have any pain in your eye.   You have any discharge from your eye.  Watch closely for changes in your health, and be sure to contact your doctor if:   Your eye color is not steadily returning to normal.   The blood has not gone away after 2 to 3 weeks.   You develop bruising or bleeding elsewhere, such as the gums or the skin, or you have nosebleeds.  Where can you learn more?  Go to [email or web address] and sign in to your MyChart account. Enter [phone] in the [place] box to learn more about "Subconjunctival Hemorrhage: Care Instructions."     If you do not have an account, please click on the "Sign Up Now" link.  Current as of: Jul 11, 2014  Content Version: 11.2   2006-2017 Healthwise, Incorporated. Care instructions adapted under license by [facility]. If you have questions about a medical condition or this instruction, always ask your healthcare professional. [place] any warranty or liability for your use of this information.

## 2015-08-02 NOTE — Progress Notes (Signed)
Pt did have a recent ED visit for a UTI, denies seeing any specialist since last OV.

## 2015-08-07 NOTE — Progress Notes (Signed)
Subjective:     Patient: Kaitlin Kelly is a 40 y.o. female       HPI patient complains of redness of her right eye, also states it feels irritated.  No visual disturbance check.  No change in visual acuity.  No pain with eye movement.  Denies any foreign body to the eye.  Denies any acute injury or trauma to the eye.  States she woke up on Monday with it.  Denies any frequent sneezing, coughing, or straining when using the bathroom.  Eyes any upper respiratory infection symptoms.  The redness is confined to the conjunctiva oh her right eye.  No blurred vision.    Review of Systems   Constitutional: Positive for chills. Negative for fever.   HENT: Negative for sneezing and sore throat.         Allergies   Allergen Reactions   . Shellfish-Derived Products      Current Outpatient Prescriptions on File Prior to Visit   Medication Sig Dispense Refill   . drospirenone-ethinyl estradiol (LORYNA) 3-0.02 MG per tablet Take 1 tablet by mouth daily 84 tablet 1   . buPROPion (WELLBUTRIN XL) 300 MG extended release tablet Take 1 tablet by mouth every morning 90 tablet 1   . ferrous sulfate (FERRO-BOB) 325 (65 FE) MG tablet Take 1 tablet by mouth 2 times daily (with meals) 180 tablet 3   . omeprazole (PRILOSEC) 20 MG capsule Take 20 mg by mouth daily     . vitamin D (ERGOCALCIFEROL) 50000 UNITS CAPS capsule      . vitamin D (CHOLECALCIFEROL) 50000 UNIT CAPS Take 1 capsule by mouth once a week 12 capsule 0     No current facility-administered medications on file prior to visit.       Past Medical History:   Diagnosis Date   . Anemia    . Contraception management    . Depression    . Headache    . Sleep apnea    . Sleep apnea 2016      Social History   Substance Use Topics   . Smoking status: Never Smoker   . Smokeless tobacco: Not on file   . Alcohol use Yes      Comment: occasionally      Past Surgical History:   Procedure Laterality Date   . GASTRIC BYPASS SURGERY  2009   . GASTRIC BYPASS SURGERY  2009   . UPPER GASTROINTESTINAL  ENDOSCOPY  05/2013    [facility]     Family History   Problem Relation Age of Onset   . High Blood Pressure Mother    . Kidney Disease Father    . High Blood Pressure Father    . Diabetes Father    . No Known Problems Brother    . Cancer Paternal Grandfather      unknown       Objective:     BP 111/77 (Site: Left Arm, Position: Sitting, Cuff Size: Thigh)  Pulse 75  Temp 98.3 F (36.8 C) (Tympanic)   Ht 5' 8"  (1.727 m)  Wt (!) 318 lb (144.2 kg)  SpO2 97%  BMI 48.35 kg/m2    Physical Exam   Constitutional: She appears well-developed and well-nourished. No distress.   HENT:   Head: Normocephalic and atraumatic.   Right Ear: External ear normal.   Left Ear: External ear normal.   Nose: Nose normal.   Eyes: EOM are normal. Pupils are equal, round, and reactive to light. Right  eye exhibits no discharge. Left eye exhibits no discharge. No scleral icterus.   Subconjunctival hemorrhage of the medial aspect of the conjunctiva as well as superior portion, all confluent.  Pupil completely intact.   Neck: Neck supple. No tracheal deviation present.   Cardiovascular: Normal rate, regular rhythm and normal heart sounds.  Exam reveals no gallop and no friction rub.    No murmur heard.  Pulmonary/Chest: Effort normal and breath sounds normal. No respiratory distress. She has no wheezes. She has no rales.   Lymphadenopathy:     She has no cervical adenopathy.   Skin: She is not diaphoretic.       Assessment      1. Subconjunctival hemorrhage, right         Plan      1. Subconjunctival hemorrhage, right  Information printed on condition and given to patient.  Advised to return if symptoms worsen, or any change division or any pain develops.  Advised artificial tear drops p.r.n.    Phyllis Ginger, MD

## 2015-08-30 MED ORDER — BUPROPION HCL ER (XL) 300 MG PO TB24
300 MG | ORAL_TABLET | ORAL | 1 refills | Status: DC
Start: 2015-08-30 — End: 2016-05-02

## 2015-09-14 ENCOUNTER — Ambulatory Visit: Admit: 2015-09-14 | Discharge: 2015-09-14 | Payer: PRIVATE HEALTH INSURANCE | Attending: Women's Health

## 2015-09-14 DIAGNOSIS — N926 Irregular menstruation, unspecified: Secondary | ICD-10-CM

## 2015-09-14 LAB — POCT URINE PREGNANCY: Preg Test, Ur: NEGATIVE

## 2015-09-14 NOTE — Progress Notes (Signed)
Chief Complaint   Patient presents with   . Confirmation     missed menses for a couple of months.       Patient's last menstrual period was 06/15/2015 (approximate).     History:    Past Medical History:   Diagnosis Date   . Anemia    . Contraception management    . Depression    . Headache    . Sleep apnea    . Sleep apnea 2016       Past Surgical History:   Procedure Laterality Date   . GASTRIC BYPASS SURGERY  2009   . GASTRIC BYPASS SURGERY  2009   . UPPER GASTROINTESTINAL ENDOSCOPY  05/2013    [facility]       Family History   Problem Relation Age of Onset   . High Blood Pressure Mother    . Kidney Disease Father    . High Blood Pressure Father    . Diabetes Father    . No Known Problems Brother    . Cancer Paternal Grandfather      unknown       Social History     Social History   . Marital status: Single     Spouse name: N/A   . Number of children: N/A   . Years of education: N/A     Social History Main Topics   . Smoking status: Never Smoker   . Smokeless tobacco: None   . Alcohol use Yes      Comment: occasionally   . Drug use: No   . Sexual activity: Not Asked     Other Topics Concern   . None     Social History Narrative       Allergies:    Allergies   Allergen Reactions   . Shellfish-Derived Products        Medications:    Current Outpatient Prescriptions on File Prior to Visit   Medication Sig Dispense Refill   . buPROPion (WELLBUTRIN XL) 300 MG extended release tablet TAKE 1 TABLET BY MOUTH EVERY MORNING 90 tablet 1   . vitamin D (CHOLECALCIFEROL) 1000 units TABS tablet Take 1,000 Units by mouth daily     . drospirenone-ethinyl estradiol (LORYNA) 3-0.02 MG per tablet Take 1 tablet by mouth daily 84 tablet 1   . cyanocobalamin 2000 MCG tablet Take 2,000 mcg by mouth daily     . vitamin D (ERGOCALCIFEROL) 50000 UNITS CAPS capsule      . ferrous sulfate (FERRO-BOB) 325 (65 FE) MG tablet Take 1 tablet by mouth 2 times daily (with meals) 180 tablet 3   . vitamin D (CHOLECALCIFEROL) 50000 UNIT CAPS Take  1 capsule by mouth once a week 12 capsule 0   . omeprazole (PRILOSEC) 20 MG capsule Take 20 mg by mouth daily       No current facility-administered medications on file prior to visit.        HPI:    HPI Comments: Patient here for pregnancy test.  States she has not had period in two months.  Urine hcg is negative today.  She is currently on Yaz for contraception.  States she has been taking regularly.  Denies missing any pills.  States she was also having some breast tenderness and emotional changes.  She is up to date on her mammograms.  Last mammogram 12/2014 and negative.  She denies any pelvic pain.  Denies troubles with bladder and bowels.  Denies fevers, chills, N/V.  Denies unusual discharge.  Denies vaginal itching and odor.  She was seen in January and evaluated for AUB.  She was having regular menses, but also having brown discharge that was suspected to be irregular bleeding.  She had negative hcg and negative vaginal cultures.  She was advised to have pelvic U/S, but did not f/u.  Sexually active with one partner.      ROS:    Review of Systems   Constitutional: Negative.    Gastrointestinal: Negative for constipation, diarrhea, nausea and vomiting.   Genitourinary: Positive for menstrual problem. Negative for difficulty urinating, dysuria, vaginal bleeding, vaginal discharge and vaginal pain.       Physical exam:    BP 111/75 (Site: Left Arm, Position: Sitting, Cuff Size: Large Adult)  Pulse 86  Ht 5' 8"  (1.727 m)  Wt (!) 321 lb 6.4 oz (145.8 kg)  LMP 06/15/2015 (Approximate)  BMI 48.87 kg/m2    Physical Exam   Constitutional: She is oriented to person, place, and time. She appears well-developed and well-nourished.   HENT:   Head: Normocephalic and atraumatic.   Pulmonary/Chest: Effort normal.   Abdominal: Soft.   Musculoskeletal: Normal range of motion.   Neurological: She is alert and oriented to person, place, and time.   Skin: Skin is warm and dry.   Psychiatric: She has a normal mood and  affect.       Assessment and Plan:    Kaitlin Kelly was seen today for confirmation.    Diagnoses and all orders for this visit:    Missed menses  -     POCT urine pregnancy    Discussed missed menses likely r/t her birth control.  However, I discussed with patient that I would like her to complete her pelvic U/S to r/o other pathology and be certain.  Pt is agreeable.  We will than go over results at her annual appointment next month.    Return if symptoms worsen or fail to improve, for Advised to keep scheduled appointment next month.Marland Kitchen

## 2015-09-15 NOTE — Telephone Encounter (Signed)
Pelvic Complete US scheduled for 09/27/15 @ 10:30am, 32 oz water 1 1/2 hours. Pt aware and will attend.

## 2015-09-27 ENCOUNTER — Inpatient Hospital Stay: Admit: 2015-09-27 | Attending: Obstetrics & Gynecology

## 2015-09-27 NOTE — Telephone Encounter (Signed)
From: Kaitlin Kelly  To: [name], DO  Sent: 09/27/2015 2:54 PM EDT  Subject: Non-Urgent Medical Question    I am applying for FMLA from my job so that I can go to my appointments with my therapist without being harassed here at work. Since my therapist isn't a physician and Dr. Barnet Pall writes the prescription for my anti depressants would Dr. Barnet Pall complete the FMLA form or would it be the therapist?   Also, I'm having a hard time keeping control of my emotions and feelings and think that I may need an adjustment to my medication.

## 2015-09-27 NOTE — Other (Unsigned)
Patient Acct Nbr: [id number]   Primary AUTH/CERT:   [place] Name: [name] name: Kaitlin Kelly  Primary Insurance Group Number: [id number]  Primary Insurance Plan Type: Health  Primary Insurance Policy Number: [id number]

## 2015-10-09 ENCOUNTER — Encounter: Attending: Women's Health

## 2015-10-10 ENCOUNTER — Ambulatory Visit: Admit: 2015-10-10 | Discharge: 2015-10-10 | Payer: PRIVATE HEALTH INSURANCE | Attending: Women's Health

## 2015-10-10 DIAGNOSIS — Z01419 Encounter for gynecological examination (general) (routine) without abnormal findings: Secondary | ICD-10-CM

## 2015-10-10 MED ORDER — DROSPIRENONE-ETHINYL ESTRADIOL 3-0.02 MG PO TABS
3-0.02 | ORAL_TABLET | Freq: Every day | ORAL | 4 refills | Status: AC
Start: 2015-10-10 — End: ?

## 2015-10-10 NOTE — Progress Notes (Signed)
Chief Complaint   Patient presents with   . Gynecologic Exam     Annual/pap       Patient's last menstrual period was 06/15/2015 (approximate).     History:    Past Medical History:   Diagnosis Date   . Anemia    . Contraception management    . Depression    . Headache    . Sleep apnea    . Sleep apnea 2016       Past Surgical History:   Procedure Laterality Date   . GASTRIC BYPASS SURGERY  2009   . GASTRIC BYPASS SURGERY  2009   . UPPER GASTROINTESTINAL ENDOSCOPY  05/2013    [facility]       Family History   Problem Relation Age of Onset   . High Blood Pressure Mother    . Kidney Disease Father    . High Blood Pressure Father    . Diabetes Father    . No Known Problems Brother    . Cancer Paternal Grandfather      unknown       Social History     Social History   . Marital status: Single     Spouse name: N/A   . Number of children: N/A   . Years of education: N/A     Social History Main Topics   . Smoking status: Never Smoker   . Smokeless tobacco: None   . Alcohol use Yes      Comment: occasionally   . Drug use: No   . Sexual activity: Not Asked     Other Topics Concern   . None     Social History Narrative       Allergies:    Allergies   Allergen Reactions   . Shellfish-Derived Products        Medications:    Current Outpatient Prescriptions on File Prior to Visit   Medication Sig Dispense Refill   . cyanocobalamin 2000 MCG tablet Take 2,000 mcg by mouth daily     . vitamin D (CHOLECALCIFEROL) 1000 units TABS tablet Take 1,000 Units by mouth daily     . buPROPion (WELLBUTRIN XL) 300 MG extended release tablet TAKE 1 TABLET BY MOUTH EVERY MORNING 90 tablet 1   . vitamin D (ERGOCALCIFEROL) 50000 UNITS CAPS capsule      . ferrous sulfate (FERRO-BOB) 325 (65 FE) MG tablet Take 1 tablet by mouth 2 times daily (with meals) 180 tablet 3   . vitamin D (CHOLECALCIFEROL) 50000 UNIT CAPS Take 1 capsule by mouth once a week 12 capsule 0   . omeprazole (PRILOSEC) 20 MG capsule Take 20 mg by mouth daily       No current  facility-administered medications on file prior to visit.        HPI:    HPI Comments: G2P1 here for APE.  Denies troubles with vaginal bleeding.  She does not get menses on her OCP's.  Denies troubles with breasts.  Last mammogram 12/2014.  She denies pelvic pain.  Denies unusual vaginal discharge.  Denies vaginal itching and odor.  Denies troubles with bladder and bowels.  Last pap 2016--LSIL, +HPV 18, +other HPV.  S/P colp.  She is sexually active.  She would like GC/CT screening.  She would like to continue on OCP's for contraception.  She would like to continue on these.  Denies HTN, diabetes, blood clots, clotting disorder, gallbladder disease, and migraines.  She does not smoke.  Gynecologic Exam         ROS:    Review of Systems   Constitutional: Negative.    Gastrointestinal: Negative for constipation and diarrhea.   Genitourinary: Negative for difficulty urinating, dysuria, frequency, menstrual problem, vaginal bleeding, vaginal discharge and vaginal pain.       Physical exam:    BP 126/80 (Site: Right Arm, Position: Sitting, Cuff Size: Large Adult)  Pulse 66  Ht 5' 9.6" (1.768 m)  Wt (!) 320 lb 12.8 oz (145.5 kg)  LMP 06/15/2015 (Approximate)  BMI 46.56 kg/m2    Physical Exam   Constitutional: She is oriented to person, place, and time. She appears well-developed and well-nourished.   HENT:   Head: Normocephalic and atraumatic.   Neck: Normal range of motion.   Pulmonary/Chest: Effort normal. Right breast exhibits no inverted nipple, no mass, no nipple discharge, no skin change and no tenderness. Left breast exhibits no inverted nipple, no mass, no nipple discharge, no skin change and no tenderness. Breasts are symmetrical.   Abdominal: Soft.   Genitourinary: Vagina normal. No labial fusion. There is no rash, tenderness, lesion or injury on the right labia. There is no rash, tenderness, lesion or injury on the left labia. Uterus is not deviated, not enlarged, not fixed and not tender. Cervix  exhibits no motion tenderness, no discharge and no friability. Right adnexum displays no mass, no tenderness and no fullness. Left adnexum displays no mass, no tenderness and no fullness.   Musculoskeletal: Normal range of motion.   Neurological: She is alert and oriented to person, place, and time.   Skin: Skin is warm and dry.   Psychiatric: She has a normal mood and affect.       Assessment and Plan:    Prisha was seen today for gynecologic exam.    Diagnoses and all orders for this visit:    Visit for gynecologic examination  -     PAP SMEAR    STD exposure  -     C. Trachomatis / N. Gonorrhoeae, DNA    Encounter for surveillance of contraceptive pills  -     drospirenone-ethinyl estradiol (LORYNA) 3-0.02 MG per tablet; Take 1 tablet by mouth daily    Encounter for screening mammogram for breast cancer  -     MAM Digital Screen Bilateral        Return in about 1 year (around 10/09/2016) for Annual.

## 2015-10-11 LAB — C. TRACHOMATIS / N. GONORRHOEAE, DNA
C. trachomatis DNA: NOT DETECTED
NEISSERIA GONORRHOEAE, DNA: NOT DETECTED

## 2015-10-16 LAB — PAP SMEAR

## 2015-10-17 LAB — HPV, HIGH RISK
HPV, Genotype 16: NOT DETECTED NA
HPV, Genotype 18: NOT DETECTED NA
Other HR HPV Genotypes: NOT DETECTED NA

## 2015-10-19 ENCOUNTER — Encounter: Attending: Women's Health

## 2015-10-19 ENCOUNTER — Encounter: Attending: Family Medicine

## 2015-11-15 ENCOUNTER — Encounter: Attending: Family Medicine

## 2015-12-21 ENCOUNTER — Encounter: Attending: Family Medicine

## 2016-05-02 MED ORDER — BUPROPION HCL ER (XL) 300 MG PO TB24
300 MG | ORAL_TABLET | ORAL | 0 refills | Status: AC
Start: 2016-05-02 — End: ?

## 2016-12-06 ENCOUNTER — Encounter
